# Patient Record
Sex: Female | Born: 1970
Health system: Southern US, Community
[De-identification: ages and names within clinical notes are randomized; demographics above are authoritative.]

## PROBLEM LIST (undated history)

## (undated) DIAGNOSIS — E119 Type 2 diabetes mellitus without complications: Secondary | ICD-10-CM

## (undated) DIAGNOSIS — N200 Calculus of kidney: Secondary | ICD-10-CM

## (undated) DIAGNOSIS — R000 Tachycardia, unspecified: Secondary | ICD-10-CM

## (undated) DIAGNOSIS — K5790 Diverticulosis of intestine, part unspecified, without perforation or abscess without bleeding: Secondary | ICD-10-CM

## (undated) DIAGNOSIS — K219 Gastro-esophageal reflux disease without esophagitis: Secondary | ICD-10-CM

## (undated) DIAGNOSIS — K222 Esophageal obstruction: Secondary | ICD-10-CM

## (undated) DIAGNOSIS — N83209 Unspecified ovarian cyst, unspecified side: Secondary | ICD-10-CM

## (undated) DIAGNOSIS — J309 Allergic rhinitis, unspecified: Secondary | ICD-10-CM

## (undated) DIAGNOSIS — K449 Diaphragmatic hernia without obstruction or gangrene: Secondary | ICD-10-CM

## (undated) DIAGNOSIS — G4730 Sleep apnea, unspecified: Secondary | ICD-10-CM

## (undated) DIAGNOSIS — I10 Essential (primary) hypertension: Secondary | ICD-10-CM

## (undated) DIAGNOSIS — R5383 Other fatigue: Secondary | ICD-10-CM

## (undated) DIAGNOSIS — M7030 Other bursitis of elbow, unspecified elbow: Secondary | ICD-10-CM

## (undated) HISTORY — DX: Gastro-esophageal reflux disease without esophagitis: K21.9

## (undated) HISTORY — DX: Other fatigue: R53.83

## (undated) HISTORY — DX: Allergic rhinitis, unspecified: J30.9

## (undated) HISTORY — DX: Diverticulosis of intestine, part unspecified, without perforation or abscess without bleeding: K57.90

## (undated) HISTORY — DX: Calculus of kidney: N20.0

## (undated) HISTORY — DX: Essential (primary) hypertension: I10

## (undated) HISTORY — DX: Sleep apnea, unspecified: G47.30

## (undated) HISTORY — PX: UPPER GASTROINTESTINAL ENDOSCOPY: SHX188

## (undated) HISTORY — DX: Esophageal obstruction: K22.2

## (undated) HISTORY — DX: Other bursitis of elbow, unspecified elbow: M70.30

## (undated) HISTORY — DX: Diaphragmatic hernia without obstruction or gangrene: K44.9

## (undated) HISTORY — DX: Type 2 diabetes mellitus without complications: E11.9

## (undated) HISTORY — DX: Unspecified ovarian cyst, unspecified side: N83.209

---
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
## 1991-06-01 HISTORY — PX: OTHER SURGICAL HISTORY: SHX169

## 1994-05-31 DIAGNOSIS — E119 Type 2 diabetes mellitus without complications: Secondary | ICD-10-CM

## 1994-05-31 HISTORY — DX: Type 2 diabetes mellitus without complications: E11.9

## 1998-08-26 ENCOUNTER — Encounter: Admission: RE | Admit: 1998-08-26 | Discharge: 1998-11-24 | Payer: Self-pay | Admitting: Internal Medicine

## 2000-03-16 ENCOUNTER — Encounter: Admission: RE | Admit: 2000-03-16 | Discharge: 2000-06-14 | Payer: Self-pay | Admitting: Internal Medicine

## 2000-05-03 ENCOUNTER — Encounter: Payer: Self-pay | Admitting: Gastroenterology

## 2000-05-03 ENCOUNTER — Encounter: Admission: RE | Admit: 2000-05-03 | Discharge: 2000-05-03 | Payer: Self-pay | Admitting: Gastroenterology

## 2001-02-28 ENCOUNTER — Other Ambulatory Visit: Admission: RE | Admit: 2001-02-28 | Discharge: 2001-02-28 | Payer: Self-pay | Admitting: Obstetrics and Gynecology

## 2001-07-11 ENCOUNTER — Inpatient Hospital Stay (HOSPITAL_COMMUNITY): Admission: AD | Admit: 2001-07-11 | Discharge: 2001-07-11 | Payer: Self-pay | Admitting: Obstetrics and Gynecology

## 2001-08-07 ENCOUNTER — Encounter: Payer: Self-pay | Admitting: Obstetrics and Gynecology

## 2001-08-07 ENCOUNTER — Inpatient Hospital Stay (HOSPITAL_COMMUNITY): Admission: AD | Admit: 2001-08-07 | Discharge: 2001-08-09 | Payer: Self-pay | Admitting: Obstetrics and Gynecology

## 2001-08-10 ENCOUNTER — Inpatient Hospital Stay (HOSPITAL_COMMUNITY): Admission: AD | Admit: 2001-08-10 | Discharge: 2001-08-13 | Payer: Self-pay | Admitting: *Deleted

## 2001-09-19 ENCOUNTER — Other Ambulatory Visit: Admission: RE | Admit: 2001-09-19 | Discharge: 2001-09-19 | Payer: Self-pay | Admitting: Obstetrics and Gynecology

## 2003-01-22 ENCOUNTER — Ambulatory Visit (HOSPITAL_COMMUNITY): Admission: RE | Admit: 2003-01-22 | Discharge: 2003-01-22 | Payer: Self-pay | Admitting: Internal Medicine

## 2003-01-22 ENCOUNTER — Encounter: Payer: Self-pay | Admitting: Internal Medicine

## 2003-02-18 ENCOUNTER — Other Ambulatory Visit: Admission: RE | Admit: 2003-02-18 | Discharge: 2003-02-18 | Payer: Self-pay | Admitting: Physical Therapy

## 2003-04-23 ENCOUNTER — Ambulatory Visit (HOSPITAL_COMMUNITY): Admission: RE | Admit: 2003-04-23 | Discharge: 2003-04-23 | Payer: Self-pay | Admitting: Internal Medicine

## 2004-07-02 ENCOUNTER — Ambulatory Visit: Payer: Self-pay | Admitting: Internal Medicine

## 2004-07-20 ENCOUNTER — Encounter: Admission: RE | Admit: 2004-07-20 | Discharge: 2004-07-20 | Payer: Self-pay | Admitting: Internal Medicine

## 2004-08-21 ENCOUNTER — Ambulatory Visit (HOSPITAL_COMMUNITY): Admission: RE | Admit: 2004-08-21 | Discharge: 2004-08-21 | Payer: Self-pay | Admitting: Obstetrics and Gynecology

## 2006-04-18 ENCOUNTER — Ambulatory Visit: Payer: Self-pay | Admitting: Internal Medicine

## 2011-03-24 ENCOUNTER — Inpatient Hospital Stay (HOSPITAL_COMMUNITY)
Admission: AD | Admit: 2011-03-24 | Discharge: 2011-03-26 | DRG: 641 | Disposition: A | Discharge status: Home / Self Care | Payer: 59 | Source: Ambulatory Visit | Attending: Internal Medicine | Admitting: Internal Medicine

## 2011-03-24 DIAGNOSIS — I10 Essential (primary) hypertension: Secondary | ICD-10-CM | POA: Diagnosis present

## 2011-03-24 DIAGNOSIS — E871 Hypo-osmolality and hyponatremia: Principal | ICD-10-CM | POA: Diagnosis present

## 2011-03-24 DIAGNOSIS — Z794 Long term (current) use of insulin: Secondary | ICD-10-CM

## 2011-03-24 DIAGNOSIS — Z79899 Other long term (current) drug therapy: Secondary | ICD-10-CM

## 2011-03-24 DIAGNOSIS — Z9119 Patient's noncompliance with other medical treatment and regimen: Secondary | ICD-10-CM

## 2011-03-24 DIAGNOSIS — E119 Type 2 diabetes mellitus without complications: Secondary | ICD-10-CM | POA: Diagnosis present

## 2011-03-24 DIAGNOSIS — Z91199 Patient's noncompliance with other medical treatment and regimen due to unspecified reason: Secondary | ICD-10-CM

## 2011-03-24 DIAGNOSIS — R112 Nausea with vomiting, unspecified: Secondary | ICD-10-CM | POA: Diagnosis present

## 2011-03-24 LAB — COMPREHENSIVE METABOLIC PANEL
ALT: 11 U/L (ref 0–35)
AST: 11 U/L (ref 0–37)
Albumin: 2.9 g/dL — ABNORMAL LOW (ref 3.5–5.2)
Alkaline Phosphatase: 124 U/L — ABNORMAL HIGH (ref 39–117)
BUN: 19 mg/dL (ref 6–23)
CO2: 24 mEq/L (ref 19–32)
Calcium: 10.3 mg/dL (ref 8.4–10.5)
Chloride: 95 mEq/L — ABNORMAL LOW (ref 96–112)
Creatinine, Ser: 0.78 mg/dL (ref 0.50–1.10)
GFR calc Af Amer: >90 mL/min (ref >90)
GFR calc non Af Amer: >90 mL/min (ref >90)
Glucose, Bld: 258 mg/dL — ABNORMAL HIGH (ref 70–99)
Potassium: 3.4 mEq/L — ABNORMAL LOW (ref 3.5–5.1)
Sodium: 134 mEq/L — ABNORMAL LOW (ref 135–145)
Total Bilirubin: 0.3 mg/dL (ref 0.3–1.2)
Total Protein: 8.3 g/dL (ref 6.0–8.3)

## 2011-03-24 LAB — CBC
HCT: 36.8 % (ref 36.0–46.0)
Hemoglobin: 12.6 g/dL (ref 12.0–15.0)
MCH: 27.2 pg (ref 26.0–34.0)
MCHC: 34.2 g/dL (ref 30.0–36.0)
MCV: 79.3 fL (ref 78.0–100.0)
Platelets: 374 10*3/uL (ref 150–400)
RBC: 4.64 MIL/uL (ref 3.87–5.11)
RDW: 13.5 % (ref 11.5–15.5)
WBC: 9.8 10*3/uL (ref 4.0–10.5)

## 2011-03-24 LAB — AMYLASE: Amylase: 31 U/L (ref 0–105)

## 2011-03-24 LAB — LIPASE, BLOOD: Lipase: 28 U/L (ref 11–59)

## 2011-03-24 LAB — GLUCOSE, CAPILLARY
Glucose-Capillary: 245 mg/dL — ABNORMAL HIGH (ref 70–99)
Glucose-Capillary: 235 mg/dL — ABNORMAL HIGH (ref 70–99)

## 2011-03-24 LAB — KETONES, QUALITATIVE: Acetone, Bld: NEGATIVE

## 2011-03-25 LAB — GLUCOSE, CAPILLARY
Glucose-Capillary: 179 mg/dL — ABNORMAL HIGH (ref 70–99)
Glucose-Capillary: 270 mg/dL — ABNORMAL HIGH (ref 70–99)
Glucose-Capillary: 273 mg/dL — ABNORMAL HIGH (ref 70–99)
Glucose-Capillary: 290 mg/dL — ABNORMAL HIGH (ref 70–99)
Glucose-Capillary: 313 mg/dL — ABNORMAL HIGH (ref 70–99)
Glucose-Capillary: 329 mg/dL — ABNORMAL HIGH (ref 70–99)

## 2011-03-26 LAB — BASIC METABOLIC PANEL
BUN: 13 mg/dL (ref 6–23)
CO2: 29 mEq/L (ref 19–32)
Calcium: 9.7 mg/dL (ref 8.4–10.5)
Chloride: 100 mEq/L (ref 96–112)
Creatinine, Ser: 0.73 mg/dL (ref 0.50–1.10)
GFR calc Af Amer: >90 mL/min (ref >90)
GFR calc non Af Amer: >90 mL/min (ref >90)
Glucose, Bld: 237 mg/dL — ABNORMAL HIGH (ref 70–99)
Potassium: 3.3 mEq/L — ABNORMAL LOW (ref 3.5–5.1)
Sodium: 139 mEq/L (ref 135–145)

## 2011-03-26 LAB — GLUCOSE, CAPILLARY
Glucose-Capillary: 273 mg/dL — ABNORMAL HIGH (ref 70–99)
Glucose-Capillary: 255 mg/dL — ABNORMAL HIGH (ref 70–99)
Glucose-Capillary: 251 mg/dL — ABNORMAL HIGH (ref 70–99)

## 2011-03-30 NOTE — Discharge Summary (Signed)
NAMECHERRELLE, PLANTE             ACCOUNT NO.:  [id number]  MEDICAL RECORD NO.:  [id number]  LOCATION:  6734                         FACILITY:  [facility]  PHYSICIAN:  Geoffry Paradise, M.D.  DATE OF BIRTH:  12-29-1970  DATE OF ADMISSION:  03/24/2011 DATE OF DISCHARGE:  03/26/2011                              DISCHARGE SUMMARY   DIAGNOSES AT TIME OF DISCHARGE: 1. Nausea and vomiting with dehydration and weight loss. 2. Hyperosmolar state with volume depletion. 3. Mild hyponatremia. 4. Essential hypertension. 5. Diabetes mellitus type 2 with poor control.  HISTORY OF PRESENT ILLNESS:  Ms. Claudio is a pleasant 40 year old well known to myself with a longstanding diabetes mellitus type 2, who was noncompliant and lost to followup presenting with blood sugars in the 500s, weakness, nausea, vomiting and weight loss.  She was originally seen about 7 days earlier, felt to be hyperosmolar with blood sugars in the 500s, but not frankly vomiting at that point.  She was febrile following an extensive workup, felt to have a viral illness, discharged in sliding scale insulin.  She re-presented on the day of admission on October 24 with depressive lability in blood sugars sometimes up to 500 and 12-pound weight loss with total intolerance by her report to any p.o.'s.  She denied any abdominal pain, was having some nausea and vomiting, and was admitted for further management. While in the waiting admission, she was treated aggressively in the office with antiemetics, sliding-scale insulin, leading up to the initial blood work here in the hospital.  For details, see the computer generated H and P from our office note October 24.  DATA:  Chemistries; sodium 134, potassium 3.4, chloride 95, CO2 24, glucose 258, BUN 19, creatinine 0.78, bilirubin 0.3, alk phos 124.  SGOT 11, SGPT 11, protein 8.3, albumin 2.9, calcium 10.3, amylase is 30, lipase is 28.  CBC; hemoglobin is 12.6, hematocrit 36.8,  white blood count is 9.8, platelet count is 374,000.  HOSPITAL COURSE:  The patient was admitted, hydrated with normal saline, treated with Lantus insulin.  Diabetic teaching was provided.  Diet was advanced at all times.  She had a benign abdominal exam.  No pain, had occasional loose stools.  She had no further nausea or vomiting throughout the hospitalization, had significant p.o. intake and was ambulating.  Blood sugars ran in the mid to upper 200s once diet was advanced, but she is certainly stable for discharge with further to be accomplished as an outpatient.  The patient is discharged in improved and stable condition, tolerating a diet quite well since yesterday.  She is competent with her Lantus and Humalog.  She has been independent in ADLs and ambulating.  She has had no further nausea or vomiting.  Further will be accomplished as an outpatient.  Diet is a carb-modified diet.  She will follow up with Dr. Jacky Kindle within the week in the office setting.  DISCHARGE MEDICATIONS: 1. Losartan 50 mg 1 daily. 2. Fluoxetine 40 mg 1 daily. 3. Omeprazole 20 mg daily. 4. Lantus insulin 25 units at bedtime. 5. Humalog or NovoLog insulin 4 units before each meal, plus sliding     scale as indicated.  She is to check  CBGs a.c. and nightly and we will adjust further in the outpatient setting.          ______________________________ Geoffry Paradise, M.D.     RA/MEDQ  D:  03/26/2011  T:  03/26/2011  Job:  [id number]  Electronically Signed by Geoffry Paradise M.D. on 03/30/2011 06:16:18 PM

## 2012-08-25 ENCOUNTER — Other Ambulatory Visit (INDEPENDENT_AMBULATORY_CARE_PROVIDER_SITE_OTHER): Payer: Self-pay | Admitting: General Surgery

## 2012-08-25 ENCOUNTER — Encounter (INDEPENDENT_AMBULATORY_CARE_PROVIDER_SITE_OTHER): Payer: Self-pay | Admitting: General Surgery

## 2012-08-25 ENCOUNTER — Ambulatory Visit (INDEPENDENT_AMBULATORY_CARE_PROVIDER_SITE_OTHER): Payer: 59 | Admitting: General Surgery

## 2012-08-25 VITALS — BP 154/84 | HR 86 | Resp 18 | Ht 66.0 in | Wt 249.0 lb

## 2012-08-25 DIAGNOSIS — L02219 Cutaneous abscess of trunk, unspecified: Secondary | ICD-10-CM

## 2012-08-25 HISTORY — PX: OTHER SURGICAL HISTORY: SHX169

## 2012-08-25 MED ORDER — DOXYCYCLINE HYCLATE 100 MG PO TABS: 100.0000 mg | ORAL_TABLET | Freq: Two times a day (BID) | ORAL | Status: DC

## 2012-08-25 NOTE — Addendum Note (Signed)
Addended byLiliana Cline on: 08/25/2012 05:04 PM   Modules accepted: Orders

## 2012-08-25 NOTE — Patient Instructions (Addendum)
Remove packing in the shower on Sunday. Clean open area twice a day with warm water and apply a bulky dressing.Stop taking Augmentin and take doxycycline instead.

## 2012-08-25 NOTE — Progress Notes (Signed)
Patient ID: Catherine Hebert, female   DOB: Dec 21, 1970, 42 y.o.   MRN: [id number]  Chief Complaint  Patient presents with  . Other    Eval back abscess    HPI Catherine Hebert is a 42 y.o. female.   HPI  She is referred by Dr. Jacky Kindle for further evaluation and treatment of a right lateral abdominal wall abscess.  She noted a small area 4 days ago. She's tried to pick at it and treat it with warm compresses but it has continued to enlarge. She denies fever or chills.  She saw Dr. Jacky Kindle who put her on antibiotics and sent her over here for evaluation. She denies any known trauma to the area.  Past Medical History  Diagnosis Date  . Diabetes mellitus without complication   . Hypertension   . Kidney stones   . Bursitis of elbow   . Esophageal reflux disease     Past Surgical History  Procedure Laterality Date  . Cesarean section  1998  . Cesarean section  2003  . Kidney stone removal  1993    Family History  Problem Relation Age of Onset  . Diabetes Mother   . Diabetes Father     Social History History  Substance Use Topics  . Smoking status: Former Smoker    Quit date: 08/26/2006  . Smokeless tobacco: Not on file  . Alcohol Use: No    Allergies  Allergen Reactions  . Lisinopril     Current Outpatient Prescriptions  Medication Sig Dispense Refill  . amoxicillin-clavulanate (AUGMENTIN) 875-125 MG per tablet Take 1 tablet by mouth 2 (two) times daily.      Marland Kitchen aspirin 81 MG tablet Take 81 mg by mouth daily.      Marland Kitchen FLUoxetine (PROZAC) 40 MG capsule       . HUMALOG KWIKPEN 100 UNIT/ML injection       . HYDROcodone-acetaminophen (NORCO/VICODIN) 5-325 MG per tablet Take 1 tablet by mouth every 6 (six) hours as needed for pain.      Marland Kitchen losartan-hydrochlorothiazide (HYZAAR) 50-12.5 MG per tablet       . metFORMIN (GLUCOPHAGE) 500 MG tablet Take 500 mg by mouth 2 (two) times daily with a meal.      . omeprazole (PRILOSEC) 20 MG capsule        No current  facility-administered medications for this visit.    Review of Systems Review of Systems  Constitutional: Negative for fever and chills.  Respiratory: Negative.   Cardiovascular: Negative.   Gastrointestinal:       Reflux    Blood pressure 154/84, pulse 86, resp. rate 18, height 5\' 6"  (1.676 m), weight 249 lb (112.946 kg).  Physical Exam Physical Exam  Constitutional:  Obese female who appears somewhat uncomfortable. She is very pleasant and cooperative.  Abdominal:  5 cm x 11 cm erythematous area. In the middle of this there is an indurated area with a small scab. Tenderness is present at this site. This is in the right flank area.    Data Reviewed Notes from Dr. Jacky Kindle  Assessment    Right flank wall abscess. She's been started on antibiotics and given pain medicine.     Plan    Incision and drainage. Change antibiotics to doxycycline. I've given her wound care instructions. Followup in the office in 3 weeks.  Procedure: The abscess in the right flank area was sterilely prepped and then anesthetized with Xylocaine. A full thickness elliptical incision was made in the  skin and purulent fluid was evacuated. The abscess cavity was approximately 2 cm. This was packed with iodoform gauze followed by a bulky dressing. She tolerated the procedure well. She was instructed to remove the bandage and packing in the shower in 2 days then clean the area with warm water twice a day and apply a bulky pad. She was told to call if she had heavy bleeding or signs of infection increasing.        Harmon Bommarito J 08/25/2012, 3:17 PM

## 2012-09-18 ENCOUNTER — Ambulatory Visit (INDEPENDENT_AMBULATORY_CARE_PROVIDER_SITE_OTHER): Payer: 59 | Admitting: General Surgery

## 2012-09-18 ENCOUNTER — Encounter (INDEPENDENT_AMBULATORY_CARE_PROVIDER_SITE_OTHER): Payer: Self-pay | Admitting: General Surgery

## 2012-09-18 VITALS — BP 134/82 | HR 110 | Temp 97.3°F | Ht 66.0 in | Wt 247.4 lb

## 2012-09-18 DIAGNOSIS — L02219 Cutaneous abscess of trunk, unspecified: Secondary | ICD-10-CM

## 2012-09-18 NOTE — Progress Notes (Signed)
She is here for a followup visit after incision and drainage of a right flank subcutaneous abscess on 08/25/2012.  She is doing much better. The wound has healed fairly well.  On physical exam, the right flank open wound is completely healed. There is no induration or erythema around it.  Assessment: Right flank subcutaneous abscess-has responded well to incision and drainage and antibiotics. We discussed the fact that this could recur.  Plan: Keep the area clean with warm soapy water daily. Return visit as needed.

## 2012-09-18 NOTE — Patient Instructions (Signed)
Clean the area with warm soapy water daily

## 2014-02-15 ENCOUNTER — Telehealth: Payer: Self-pay | Admitting: Neurology

## 2014-02-15 NOTE — Telephone Encounter (Signed)
Call documented in husband's chart because he is our patient/ she was calling on his behalf.

## 2014-02-15 NOTE — Telephone Encounter (Signed)
Returning a call to [place]. CB# [phone] / Sherri S.

## 2015-06-01 DIAGNOSIS — R000 Tachycardia, unspecified: Secondary | ICD-10-CM

## 2015-06-01 HISTORY — DX: Tachycardia, unspecified: R00.0

## 2015-10-06 ENCOUNTER — Ambulatory Visit (INDEPENDENT_AMBULATORY_CARE_PROVIDER_SITE_OTHER): Payer: 59 | Admitting: Neurology

## 2015-10-06 ENCOUNTER — Encounter: Payer: Self-pay | Admitting: Neurology

## 2015-10-06 VITALS — BP 152/86 | HR 92 | Resp 16 | Ht 65.0 in | Wt 224.0 lb

## 2015-10-06 DIAGNOSIS — R51 Headache: Secondary | ICD-10-CM

## 2015-10-06 DIAGNOSIS — E669 Obesity, unspecified: Secondary | ICD-10-CM | POA: Diagnosis not present

## 2015-10-06 DIAGNOSIS — R351 Nocturia: Secondary | ICD-10-CM

## 2015-10-06 DIAGNOSIS — R0681 Apnea, not elsewhere classified: Secondary | ICD-10-CM | POA: Diagnosis not present

## 2015-10-06 DIAGNOSIS — R0602 Shortness of breath: Secondary | ICD-10-CM

## 2015-10-06 DIAGNOSIS — R0683 Snoring: Secondary | ICD-10-CM | POA: Diagnosis not present

## 2015-10-06 DIAGNOSIS — R519 Headache, unspecified: Secondary | ICD-10-CM

## 2015-10-06 DIAGNOSIS — R000 Tachycardia, unspecified: Secondary | ICD-10-CM | POA: Diagnosis not present

## 2015-10-06 DIAGNOSIS — G4710 Hypersomnia, unspecified: Secondary | ICD-10-CM

## 2015-10-06 DIAGNOSIS — E0842 Diabetes mellitus due to underlying condition with diabetic polyneuropathy: Secondary | ICD-10-CM | POA: Diagnosis not present

## 2015-10-06 NOTE — Progress Notes (Signed)
Subjective:    Patient ID: Catherine Hebert is a 45 y.o. female.  HPI     Catherine Age, MD, PhD [facility] [address], [address] [place], [place] [address]  Dear Dr. Reynaldo Minium,   I saw your patient, Catherine Hebert, upon your kind request in my neurologic clinic today for initial consultation of her sleep disorder, in particular, concern for underlying obstructive sleep apnea. The patient is unaccompanied today. As you know, Catherine Hebert is a 45 year old right-handed woman with an underlying medical history of type 2 diabetes, diverticulosis, reflux disease, hiatal hernia, status post esophageal dilatation, hypertension, kidney stone, and obesity, who reports snoring and excessive daytime somnolence as well as morning headaches. I reviewed your office note from 09/29/2015, which you kindly included. She had a recent sinus infection and lingering cough.  She was told to increase her omeprazole, as she was complaining of nighttime cough. She has been on omeprazole for years. She has had snoring, witnessed apneas and excessive daytime somnolence for years. Her husband has noted pauses in her breathing. Her Epworth sleepiness score is 18 out of 24 today, her fatigue score is 45 out of 63. Bedtime can be as early as 8:30 PM and she falls asleep quickly but she does not stay asleep well. She has nocturia on average 3 times per night. Wake up time is around 7 AM but she does not wake up rested. She has occasional morning headaches. Her father has obstructive sleep apnea and has been using a CPAP machine for years. Over the past 3 years she has been able to lose about 25 pounds. She has gained about 10 pounds from her lowest weight. She is trying to lose weight. 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 She quit smoking many years ago, 10+ years ago was a social smoker, never a chain smoker. She drinks about 2 cups of coffee per day, typically no sodas or tea. She  has had problems with blood sugar control. She has been diabetic for about 20 years and has a family history of diabetes on both sides. Her A1c has been as high as 12 she reports. She is supposed to have blood work again soon. She had recent blood work in your office and an EKG. She has a resting heart rate which is on the higher side in the 90s and sometimes low 100s. She denies restless leg symptoms or leg twitching at night but has had symptoms of diabetic neuropathy, no recent painful sensation. She lives with her husband of 34 years, he is 3 years older and has some medical problems. She has 2 teenage children, ages 4 and 5.  Her Past Medical History Is Significant For: Past Medical History  Diagnosis Date  . Diabetes mellitus without complication ([place])   . Hypertension   . Kidney stones   . Bursitis of elbow   . Esophageal reflux disease   . Diverticulosis   . Esophageal stricture   . GERD (gastroesophageal reflux disease)   . Hiatal hernia   . Ovarian cyst   . Fatigue     Her Past Surgical History Is Significant For: Past Surgical History  Procedure Laterality Date  . Cesarean section  1998  . Cesarean section  2003  . Kidney stone removal  1993  . I&d back abscess  08/25/12    Her Family History Is Significant For: Family History  Problem Relation Hebert of Onset  . Diabetes Mother   .  Heart attack Mother   . Diabetes Father   . Sleep apnea Father   . Heart attack Father     Her Social History Is Significant For: Social History   Social History  . Marital Status: Married    Spouse Name: N/A  . Number of Children: 2  . Years of Education: [employer]   Social History Main Topics  . Smoking status: Former Smoker    Quit date: 08/26/2006  . Smokeless tobacco: None  . Alcohol Use: No  . Drug Use: No  . Sexual Activity: Not Asked   Other Topics Concern  . None   Social History Narrative   Drinks 2 cups of coffee a day     Her Allergies Are:  Allergies   Allergen Reactions  . Lisinopril   :   Her Current Medications Are:  Outpatient Encounter Prescriptions as of 10/06/2015  Medication Sig  . aspirin 81 MG tablet Take 81 mg by mouth daily.  Marland Kitchen FLUoxetine (PROZAC) 40 MG capsule   . Insulin Degludec (TRESIBA FLEXTOUCH) 100 UNIT/ML SOPN Inject 40 Units into the skin.  Marland Kitchen JARDIANCE 10 MG TABS tablet TK 1 T PO QD  . losartan-hydrochlorothiazide (HYZAAR) 50-12.5 MG per tablet   . metFORMIN (GLUCOPHAGE) 500 MG tablet Take 500 mg by mouth 2 (two) times daily with a meal.  . omeprazole (PRILOSEC) 20 MG capsule   . TRULICITY 1.5 0000000 SOPN INJ 1.5 MG Taylor ONCE WEEKLY  . Vitamin D, Ergocalciferol, (DRISDOL) 50000 units CAPS capsule Take 50,000 Units by mouth every 7 (seven) days.  . [DISCONTINUED] HUMALOG KWIKPEN 100 UNIT/ML injection    No facility-administered encounter medications on file as of 10/06/2015.  :  Review of Systems:  Out of a complete 14 point review of systems, all are reviewed and negative with the exception of these symptoms as listed below:   Review of Systems  Constitutional: Positive for fatigue.  Neurological:       Patient states that she wakes up many times in the night, snoring, witnessed apnea, wakes up feeling tired, morning headaches, daytime tiredness, takes naps.    Epworth Sleepiness Scale 0= would never doze 1= slight chance of dozing 2= moderate chance of dozing 3= high chance of dozing  Sitting and reading:3 Watching TV:3 Sitting inactive in a public place (ex. Theater or meeting):2 As a passenger in a car for an hour without a break:3 Lying down to rest in the afternoon:3 Sitting and talking to someone:1 Sitting quietly after lunch (no alcohol):2 In a car, while stopped in traffic:1 Total:18  Objective:  Neurologic Exam  Physical Exam Physical Examination:   Filed Vitals:   10/06/15 1509  BP: 152/86  Pulse: 92  Resp: 16   General Examination: The patient is a very pleasant 45 y.o. female  in no acute distress. She appears well-developed and well-nourished and very well groomed.   HEENT: Normocephalic, atraumatic, pupils are equal, round and reactive to light and accommodation. Full wig in place. Funduscopic exam is normal with sharp disc margins noted. Extraocular tracking is good without limitation to gaze excursion or nystagmus noted. Normal smooth pursuit is noted. Hearing is grossly intact. Face is symmetric with normal facial animation and normal facial sensation. Speech is clear with no dysarthria noted. There is no hypophonia. There is no lip, neck/head, jaw or voice tremor. Neck is supple with full range of passive and active motion. There are no carotid bruits on auscultation. Oropharynx exam reveals: moderate mouth dryness, good dental  hygiene and moderate airway crowding, due to smaller airway entry, thicker soft palate, thicker tongue. Mallampati is class III. Tongue protrudes centrally and palate elevates symmetrically. Tonsils are absent. Neck size is 15.75 inches. She has a Mild overbite.  Chest: Clear to auscultation without wheezing, rhonchi or crackles noted.  Heart: S1+S2+0, regular and normal without murmurs, rubs or gallops noted.   Abdomen: Soft, non-tender and non-distended with normal bowel sounds appreciated on auscultation.  Extremities: There is no pitting edema in the distal lower extremities bilaterally. Pedal pulses are intact.  Skin: Warm and dry without trophic changes noted. There are no varicose veins.  Musculoskeletal: exam reveals no obvious joint deformities, tenderness or joint swelling or erythema.   Neurologically:  Mental status: The patient is awake, alert and oriented in all 4 spheres. Her immediate and remote memory, attention, language skills and fund of knowledge are appropriate. There is no evidence of aphasia, agnosia, apraxia or anomia. Speech is clear with normal prosody and enunciation. Thought process is linear. Mood is normal and  affect is normal.  Cranial nerves II - XII are as described above under HEENT exam. In addition: shoulder shrug is normal with equal shoulder height noted. Motor exam: Normal bulk, strength and tone is noted. There is no drift, tremor or rebound. Romberg is negative. Reflexes are 1-2+ throughout. Babinski: Toes are flexor bilaterally. Fine motor skills and coordination: intact with normal finger taps, normal hand movements, normal rapid alternating patting, normal foot taps and normal foot agility.  Cerebellar testing: No dysmetria or intention tremor on finger to nose testing. Heel to shin is unremarkable bilaterally. There is no truncal or gait ataxia.  Sensory exam: intact to light touch, pinprick, vibration, temperature sense in the upper and lower extremities, with the exception of decreased pinprick sensation in both feet, mostly in the toes and forefeet, not up to the ankles.  Gait, station and balance: She stands easily. No veering to one side is noted. No leaning to one side is noted. Posture is Hebert-appropriate and stance is narrow based. Gait shows normal stride length and normal pace. No problems turning are noted. She turns en bloc. Tandem walk is unremarkable.  Assessment and Plan:  In summary, ALMEDA HARTKOPF is a very pleasant 45 y.o.-year old female with an underlying medical history of type 2 diabetes, diverticulosis, reflux disease, hiatal hernia, status post esophageal dilatation, hypertension, kidney stone, and obesity, whose history and physical exam are indeed in keeping with obstructive sleep apnea (OSA). She has mild evidence of peripheral neuropathy, most likely diabetic polyneuropathy, and we talked about the importance of diabetes control and weight management. Thankfully, she has been able to lose weight and is working on weight loss and has had improved diabetes control with medication changes.  I had a long chat with the patient about my findings and the diagnosis of OSA, its  prognosis and treatment options. We talked about medical treatments, surgical interventions and non-pharmacological approaches. I explained in particular the risks and ramifications of untreated moderate to severe OSA, especially with respect to developing cardiovascular disease down the Road, including congestive heart failure, difficult to treat hypertension, cardiac arrhythmias, or stroke. Even type 2 diabetes has, in part, been linked to untreated OSA. Symptoms of untreated OSA include daytime sleepiness, memory problems, mood irritability and mood disorder such as depression and anxiety, lack of energy, as well as recurrent headaches, especially morning headaches. We talked about trying to maintain a healthy lifestyle in general, as well as the  importance of weight control. I encouraged the patient to eat healthy, exercise daily and keep well hydrated, to keep a scheduled bedtime and wake time routine, to not skip any meals and eat healthy snacks in between meals. I advised the patient not to drive when feeling sleepy. I recommended the following at this time: sleep study with potential positive airway pressure titration. (We will score hypopneas at 4% and split the sleep study into diagnostic and treatment portion, if the estimated. 2 hour AHI is >20/h).   I explained the sleep test procedure to the patient and also outlined possible surgical and non-surgical treatment options of OSA, including the use of a custom-made dental device (which would require a referral to a specialist dentist or oral surgeon), upper airway surgical options, such as pillar implants, radiofrequency surgery, tongue base surgery, and UPPP (which would involve a referral to an ENT surgeon). Rarely, jaw surgery such as mandibular advancement may be considered.  I also explained the CPAP treatment option to the patient, who indicated that she would be willing to try CPAP if the need arises. I explained the importance of being  compliant with PAP treatment, not only for insurance purposes but primarily to improve Her symptoms, and for the patient's long term health benefit, including to reduce Her cardiovascular risks. I answered all her questions today and the patient was in agreement. I would like to see her back after the sleep study is completed and encouraged her to call with any interim questions, concerns, problems or updates.   Thank you very much for allowing me to participate in the care of this nice patient. If I can be of any further assistance to you please do not hesitate to call me at [phone].  Sincerely,   Catherine Age, MD, PhD

## 2015-10-06 NOTE — Patient Instructions (Signed)

## 2015-10-31 ENCOUNTER — Observation Stay (HOSPITAL_COMMUNITY)
Admission: EM | Admit: 2015-10-31 | Discharge: 2015-11-02 | Disposition: A | Discharge status: Home / Self Care | Payer: 59 | Attending: Internal Medicine | Admitting: Internal Medicine

## 2015-10-31 ENCOUNTER — Encounter (HOSPITAL_COMMUNITY): Payer: Self-pay | Admitting: Emergency Medicine

## 2015-10-31 ENCOUNTER — Emergency Department (HOSPITAL_COMMUNITY): Payer: 59

## 2015-10-31 DIAGNOSIS — I10 Essential (primary) hypertension: Secondary | ICD-10-CM | POA: Diagnosis not present

## 2015-10-31 DIAGNOSIS — E119 Type 2 diabetes mellitus without complications: Secondary | ICD-10-CM | POA: Insufficient documentation

## 2015-10-31 DIAGNOSIS — Z7982 Long term (current) use of aspirin: Secondary | ICD-10-CM | POA: Diagnosis not present

## 2015-10-31 DIAGNOSIS — Z79899 Other long term (current) drug therapy: Secondary | ICD-10-CM | POA: Insufficient documentation

## 2015-10-31 DIAGNOSIS — Z7984 Long term (current) use of oral hypoglycemic drugs: Secondary | ICD-10-CM | POA: Diagnosis not present

## 2015-10-31 DIAGNOSIS — Z6836 Body mass index (BMI) 36.0-36.9, adult: Secondary | ICD-10-CM | POA: Diagnosis not present

## 2015-10-31 DIAGNOSIS — E785 Hyperlipidemia, unspecified: Secondary | ICD-10-CM | POA: Insufficient documentation

## 2015-10-31 DIAGNOSIS — I480 Paroxysmal atrial fibrillation: Secondary | ICD-10-CM | POA: Insufficient documentation

## 2015-10-31 DIAGNOSIS — Z833 Family history of diabetes mellitus: Secondary | ICD-10-CM | POA: Diagnosis not present

## 2015-10-31 DIAGNOSIS — IMO0001 Reserved for inherently not codable concepts without codable children: Secondary | ICD-10-CM

## 2015-10-31 DIAGNOSIS — F329 Major depressive disorder, single episode, unspecified: Secondary | ICD-10-CM | POA: Insufficient documentation

## 2015-10-31 DIAGNOSIS — K219 Gastro-esophageal reflux disease without esophagitis: Secondary | ICD-10-CM | POA: Diagnosis not present

## 2015-10-31 DIAGNOSIS — E669 Obesity, unspecified: Secondary | ICD-10-CM | POA: Insufficient documentation

## 2015-10-31 DIAGNOSIS — R778 Other specified abnormalities of plasma proteins: Secondary | ICD-10-CM | POA: Insufficient documentation

## 2015-10-31 DIAGNOSIS — Z8249 Family history of ischemic heart disease and other diseases of the circulatory system: Secondary | ICD-10-CM | POA: Diagnosis not present

## 2015-10-31 DIAGNOSIS — R7989 Other specified abnormal findings of blood chemistry: Secondary | ICD-10-CM

## 2015-10-31 DIAGNOSIS — I471 Supraventricular tachycardia: Secondary | ICD-10-CM | POA: Diagnosis not present

## 2015-10-31 DIAGNOSIS — D72829 Elevated white blood cell count, unspecified: Secondary | ICD-10-CM | POA: Insufficient documentation

## 2015-10-31 DIAGNOSIS — R42 Dizziness and giddiness: Secondary | ICD-10-CM | POA: Insufficient documentation

## 2015-10-31 DIAGNOSIS — Z794 Long term (current) use of insulin: Secondary | ICD-10-CM | POA: Insufficient documentation

## 2015-10-31 DIAGNOSIS — Z87891 Personal history of nicotine dependence: Secondary | ICD-10-CM | POA: Diagnosis not present

## 2015-10-31 DIAGNOSIS — I472 Ventricular tachycardia: Secondary | ICD-10-CM | POA: Insufficient documentation

## 2015-10-31 DIAGNOSIS — F32A Depression, unspecified: Secondary | ICD-10-CM

## 2015-10-31 DIAGNOSIS — B029 Zoster without complications: Secondary | ICD-10-CM | POA: Insufficient documentation

## 2015-10-31 LAB — I-STAT BETA HCG BLOOD, ED (MC, WL, AP ONLY): I-stat hCG, quantitative: <5 m[IU]/mL (ref <5)

## 2015-10-31 LAB — BASIC METABOLIC PANEL
Anion gap: 15 (ref 5–15)
BUN: 19 mg/dL (ref 6–20)
CO2: 18 mmol/L — ABNORMAL LOW (ref 22–32)
Calcium: 10.3 mg/dL (ref 8.9–10.3)
Chloride: 104 mmol/L (ref 101–111)
Creatinine, Ser: 0.87 mg/dL (ref 0.44–1.00)
GFR calc Af Amer: >60 mL/min (ref >60)
GFR calc non Af Amer: >60 mL/min (ref >60)
Glucose, Bld: 127 mg/dL — ABNORMAL HIGH (ref 65–99)
Potassium: 3.5 mmol/L (ref 3.5–5.1)
Sodium: 137 mmol/L (ref 135–145)

## 2015-10-31 LAB — GLUCOSE, CAPILLARY
Glucose-Capillary: 99 mg/dL (ref 65–99)
Glucose-Capillary: 174 mg/dL — ABNORMAL HIGH (ref 65–99)

## 2015-10-31 LAB — CBC
HCT: 40.1 % (ref 36.0–46.0)
Hemoglobin: 13.2 g/dL (ref 12.0–15.0)
MCH: 26.7 pg (ref 26.0–34.0)
MCHC: 32.9 g/dL (ref 30.0–36.0)
MCV: 81 fL (ref 78.0–100.0)
Platelets: 253 10*3/uL (ref 150–400)
RBC: 4.95 MIL/uL (ref 3.87–5.11)
RDW: 14 % (ref 11.5–15.5)
WBC: 12.8 10*3/uL — ABNORMAL HIGH (ref 4.0–10.5)

## 2015-10-31 LAB — I-STAT TROPONIN, ED: Troponin i, poc: 0.57 ng/mL (ref 0.00–0.08)

## 2015-10-31 LAB — TSH: TSH: 2.099 u[IU]/mL (ref 0.350–4.500)

## 2015-10-31 LAB — PROTIME-INR
INR: 1.03 (ref 0.00–1.49)
Prothrombin Time: 13.7 seconds (ref 11.6–15.2)

## 2015-10-31 LAB — BRAIN NATRIURETIC PEPTIDE: B Natriuretic Peptide: 548.1 pg/mL — ABNORMAL HIGH (ref 0.0–100.0)

## 2015-10-31 LAB — CBG MONITORING, ED: Glucose-Capillary: 120 mg/dL — ABNORMAL HIGH (ref 65–99)

## 2015-10-31 MED ORDER — BISACODYL 10 MG RE SUPP: 10.0000 mg | Freq: Every day | RECTAL | Status: DC | PRN

## 2015-10-31 MED ORDER — MAGNESIUM CITRATE PO SOLN: 1.0000 | Freq: Once | ORAL | Status: DC | PRN

## 2015-10-31 MED ORDER — SODIUM CHLORIDE 0.9 % IV SOLN
INTRAVENOUS | Status: DC
  Administered 2015-10-31 (×2): via INTRAVENOUS

## 2015-10-31 MED ORDER — FLUOXETINE HCL 20 MG PO CAPS
40.0000 mg | ORAL_CAPSULE | Freq: Every day | ORAL | Status: DC
  Administered 2015-10-31 – 2015-11-02 (×3): 40 mg via ORAL
  Filled 2015-10-31: qty 2
  Filled 2015-10-31: qty 1
  Filled 2015-10-31: qty 2
  Filled 2015-10-31 (×2): qty 1

## 2015-10-31 MED ORDER — HEPARIN SODIUM (PORCINE) 5000 UNIT/ML IJ SOLN: 5000.0000 [IU] | Freq: Three times a day (TID) | INTRAMUSCULAR | Status: DC

## 2015-10-31 MED ORDER — PANTOPRAZOLE SODIUM 40 MG PO TBEC
40.0000 mg | DELAYED_RELEASE_TABLET | Freq: Every day | ORAL | Status: DC
  Administered 2015-10-31 – 2015-11-02 (×3): 40 mg via ORAL
  Filled 2015-10-31 (×3): qty 1

## 2015-10-31 MED ORDER — DILTIAZEM HCL 100 MG IV SOLR
5.0000 mg/h | INTRAVENOUS | Status: DC
  Administered 2015-10-31 – 2015-11-01 (×2): 5 mg/h via INTRAVENOUS
  Filled 2015-10-31: qty 100

## 2015-10-31 MED ORDER — ACYCLOVIR 200 MG PO CAPS
800.0000 mg | ORAL_CAPSULE | Freq: Every day | ORAL | Status: DC
  Administered 2015-10-31 – 2015-11-02 (×7): 800 mg via ORAL
  Filled 2015-10-31 (×14): qty 4

## 2015-10-31 MED ORDER — ONDANSETRON HCL 4 MG PO TABS: 4.0000 mg | ORAL_TABLET | Freq: Four times a day (QID) | ORAL | Status: DC | PRN

## 2015-10-31 MED ORDER — SODIUM CHLORIDE 0.9% FLUSH
3.0000 mL | Freq: Two times a day (BID) | INTRAVENOUS | Status: DC
  Administered 2015-10-31 – 2015-11-02 (×4): 3 mL via INTRAVENOUS

## 2015-10-31 MED ORDER — ONDANSETRON HCL 4 MG/2ML IJ SOLN: 4.0000 mg | Freq: Four times a day (QID) | INTRAMUSCULAR | Status: DC | PRN

## 2015-10-31 MED ORDER — HEPARIN BOLUS VIA INFUSION
4000.0000 [IU] | Freq: Once | INTRAVENOUS | Status: DC
  Filled 2015-10-31: qty 4000

## 2015-10-31 MED ORDER — ACETAMINOPHEN 650 MG RE SUPP: 650.0000 mg | Freq: Four times a day (QID) | RECTAL | Status: DC | PRN

## 2015-10-31 MED ORDER — ASPIRIN EC 81 MG PO TBEC: 81.0000 mg | DELAYED_RELEASE_TABLET | Freq: Every day | ORAL | Status: DC

## 2015-10-31 MED ORDER — ASPIRIN EC 81 MG PO TBEC
81.0000 mg | DELAYED_RELEASE_TABLET | Freq: Every day | ORAL | Status: DC
  Administered 2015-10-31 – 2015-11-02 (×3): 81 mg via ORAL
  Filled 2015-10-31 (×4): qty 1

## 2015-10-31 MED ORDER — HYDROCODONE-ACETAMINOPHEN 5-325 MG PO TABS: 1.0000 | ORAL_TABLET | ORAL | Status: DC | PRN

## 2015-10-31 MED ORDER — DILTIAZEM HCL 25 MG/5ML IV SOLN
10.0000 mg | Freq: Once | INTRAVENOUS | Status: AC
  Administered 2015-10-31: 10 mg via INTRAVENOUS

## 2015-10-31 MED ORDER — SENNOSIDES-DOCUSATE SODIUM 8.6-50 MG PO TABS: 1.0000 | ORAL_TABLET | Freq: Every evening | ORAL | Status: DC | PRN

## 2015-10-31 MED ORDER — TRAZODONE HCL 50 MG PO TABS: 25.0000 mg | ORAL_TABLET | Freq: Every evening | ORAL | Status: DC | PRN

## 2015-10-31 MED ORDER — DILTIAZEM HCL 100 MG IV SOLR
INTRAVENOUS | Status: AC
  Filled 2015-10-31: qty 100

## 2015-10-31 MED ORDER — ACETAMINOPHEN 325 MG PO TABS: 650.0000 mg | ORAL_TABLET | Freq: Four times a day (QID) | ORAL | Status: DC | PRN

## 2015-10-31 MED ORDER — MORPHINE SULFATE (PF) 2 MG/ML IV SOLN: 1.0000 mg | INTRAVENOUS | Status: DC | PRN

## 2015-10-31 MED ORDER — DILTIAZEM HCL 100 MG IV SOLR
5.0000 mg/h | Freq: Once | INTRAVENOUS | Status: AC
  Administered 2015-10-31: 5 mg/h via INTRAVENOUS

## 2015-10-31 MED ORDER — HEPARIN (PORCINE) IN NACL 100-0.45 UNIT/ML-% IJ SOLN
1150.0000 [IU]/h | INTRAMUSCULAR | Status: DC
  Administered 2015-10-31 (×2): 1150 [IU]/h via INTRAVENOUS
  Filled 2015-10-31 (×2): qty 250

## 2015-10-31 MED ORDER — INSULIN ASPART 100 UNIT/ML ~~LOC~~ SOLN
0.0000 [IU] | Freq: Three times a day (TID) | SUBCUTANEOUS | Status: DC
  Administered 2015-11-01: 3 [IU] via SUBCUTANEOUS
  Administered 2015-11-01: 2 [IU] via SUBCUTANEOUS
  Administered 2015-11-01: 3 [IU] via SUBCUTANEOUS

## 2015-10-31 MED ORDER — METOPROLOL TARTRATE 5 MG/5ML IV SOLN
5.0000 mg | INTRAVENOUS | Status: DC
  Administered 2015-10-31 – 2015-11-01 (×4): 5 mg via INTRAVENOUS
  Filled 2015-10-31 (×4): qty 5

## 2015-10-31 NOTE — ED Provider Notes (Signed)
CSN: [id number]     Arrival date & time 10/31/15  1326 History   First MD Initiated Contact with Patient 10/31/15 1328     Chief Complaint  Patient presents with  . Tachycardia     (Consider location/radiation/quality/duration/timing/severity/associated sxs/prior Treatment) HPI   45 year old female transported via EMS from 33 office with reports that she is extremely tachycardic. Patient states that she has been feeling sick for approximately 3 weeks. She has been seen by her primary care physician several times and treated with antibiotics. She is an insulin-dependent diabetic. She states that she felt worse today and was weak. When she was seen in her primary care office she was hyperventilating and seemed very upset. Her husband's Parkinson's. She has a 71 and 24 year old child. She denies headache, head injury, neck pain, chest pain, history of DVT or PE.  Past Medical History  Diagnosis Date  . Diabetes mellitus without complication ([place])   . Hypertension   . Kidney stones   . Bursitis of elbow   . Esophageal reflux disease   . Diverticulosis   . Esophageal stricture   . GERD (gastroesophageal reflux disease)   . Hiatal hernia   . Ovarian cyst   . Fatigue    Past Surgical History  Procedure Laterality Date  . Cesarean section  1998  . Cesarean section  2003  . Kidney stone removal  1993  . I&d back abscess  08/25/12   Family History  Problem Relation Age of Onset  . Diabetes Mother   . Heart attack Mother   . Diabetes Father   . Sleep apnea Father   . Heart attack Father    Social History  Substance Use Topics  . Smoking status: Former Smoker    Quit date: 08/26/2006  . Smokeless tobacco: None  . Alcohol Use: No   OB History    No data available     Review of Systems  All other systems reviewed and are negative.     Allergies  Lisinopril  Home Medications   Prior to Admission medications   Medication Sig Start Date End Date Taking?  Authorizing Provider  aspirin 81 MG tablet Take 81 mg by mouth daily.    Historical Provider, MD  FLUoxetine (PROZAC) 40 MG capsule  08/10/12   Historical Provider, MD  Insulin Degludec (TRESIBA FLEXTOUCH) 100 UNIT/ML SOPN Inject 40 Units into the skin.    Historical Provider, MD  JARDIANCE 10 MG TABS tablet TK 1 T PO QD 07/07/15   Historical Provider, MD  losartan-hydrochlorothiazide (HYZAAR) 50-12.5 MG per tablet  08/10/12   Historical Provider, MD  metFORMIN (GLUCOPHAGE) 500 MG tablet Take 500 mg by mouth 2 (two) times daily with a meal.    Historical Provider, MD  omeprazole (PRILOSEC) 20 MG capsule  08/10/12   Historical Provider, MD  TRULICITY 1.5 0000000 SOPN INJ 1.5 MG [place] ONCE WEEKLY 09/03/15   Historical Provider, MD  Vitamin D, Ergocalciferol, (DRISDOL) 50000 units CAPS capsule Take 50,000 Units by mouth every 7 (seven) days.    Historical Provider, MD   Temp(Src) 99.8 F (37.7 C) (Rectal)  SpO2 100% Physical Exam  Constitutional: She is oriented to person, place, and time. She appears well-developed and well-nourished. She appears distressed.  HENT:  Head: Normocephalic and atraumatic.  Right Ear: External ear normal.  Left Ear: External ear normal.  Nose: Nose normal.  Mouth/Throat: Oropharynx is clear and moist.  Eyes: Conjunctivae and EOM are normal. Pupils are equal, round, and reactive  to light.  Neck: Normal range of motion. Neck supple.  Cardiovascular: Normal heart sounds and intact distal pulses.  An irregularly irregular rhythm present. Tachycardia present.   Pulmonary/Chest: Effort normal and breath sounds normal.  Abdominal: Soft. Bowel sounds are normal.  Musculoskeletal: Normal range of motion.  Neurological: She is alert and oriented to person, place, and time. She has normal reflexes.  Skin: Skin is warm and dry.  Psychiatric: She has a normal mood and affect. Her behavior is normal. Judgment and thought content normal.  Nursing note and vitals reviewed.   ED  Course  Procedures (including critical care time) Labs Review Labs Reviewed  CBC - Abnormal; Notable for the following:    WBC 12.8 (*)    All other components within normal limits  BASIC METABOLIC PANEL - Abnormal; Notable for the following:    CO2 18 (*)    Glucose, Bld 127 (*)    All other components within normal limits  CBG MONITORING, ED - Abnormal; Notable for the following:    Glucose-Capillary 120 (*)    All other components within normal limits  I-STAT TROPOININ, ED - Abnormal; Notable for the following:    Troponin i, poc 0.57 (*)    All other components within normal limits  PROTIME-INR  I-STAT BETA HCG BLOOD, ED ([facility], [facility], [facility] ONLY)    Imaging Review Dg Chest Port 1 View  10/31/2015  CLINICAL DATA:  Tachycardia.  Dizziness.  Emotional stress. EXAM: PORTABLE CHEST 1 VIEW COMPARISON:  None. FINDINGS: The heart size and mediastinal contours are within normal limits. Both lungs are clear. The visualized skeletal structures are unremarkable. IMPRESSION: No active disease. Electronically Signed   By: Nelson Chimes M.D.   On: 10/31/2015 14:32   I have personally reviewed and evaluated these images and lab results as part of my medical decision-making.   EKG Interpretation   Date/Time:  Friday October 31 2015 14:04:52 EDT Ventricular Rate:  90 PR Interval:  158 QRS Duration: 95 QT Interval:  377 QTC Calculation: 461 R Axis:   88 Text Interpretation:  Sinus rhythm Borderline repolarization abnormality  Confirmed by Jamiah Recore MD, Jaiveer Panas ([id number]) on 10/31/2015 2:42:25 PM      MDM   Final diagnoses:  Paroxysmal atrial fibrillation (HCC)  Elevated troponin    Patient presented with tachycardia. She is given IV Cardizem and converted to normal sinus rhythm. Troponin is elevated. Plan admission to cycle troponins and cardiology consult.    Pattricia Boss, MD 11/01/15 [phone]

## 2015-10-31 NOTE — ED Notes (Signed)
Pt able to ambulate independently 

## 2015-10-31 NOTE — H&P (Signed)
History and Physical    Catherine Hebert [id number] DOB: Jun 10, 1970 DOA: 10/31/2015   PCP: Geoffery Lyons, MD   Patient coming from:  Home    Chief Complaint: Tachycardia  HPI: Catherine Hebert is a 45 y.o. female with medical history significant for IDDM, Depression, GERD, hypersomnia, obesity, presenting to the emergency room with significant tachycardia. The patient had been feeling very sick over the last 3 weeks. Prior to that, she had been treated for upper respiratory infections since February, taking many antibiotics without complete resolution of the symptoms. On presentation, she was symptomatic, having palpitations, very inches and hyperventilating, tearful. She feels very weak, with no energy.  Denies fevers, chills, night sweats, vision changes, or mucositis. Denies any respiratory complaints. Denies any chest pain or pleuritic chest pain.  Denies lower extremity swelling. Denies nausea, heartburn or change in bowel habits. Denies abdominal pain. Appetite is normal. Denies any dysuria. She has been diagnosed with shingles today, at her PCPs office. Denies any bleeding issues such as epistaxis, hematemesis, hematuria or hematochezia. Denies any sick contacts or ticks. +stressors at home with sick husband with Parkinsons, teenage children and job stress. This is the first time patient experiences these tachycardic symptoms. No significant caffeine, no tobacco or other drugs. Compliant with her meds.   ED Course:  BP 125/96 mmHg  Pulse 100  Temp(Src) 99.8 F (37.7 C) (Rectal)  Resp 13  SpO2 98%   Rate was at 180 on arrival.  EKG  At this time with sinus rhythm  with borderline repolarization, QTc 461Cardizem with conversion to SR, occasional  Irregular beats.Tn 0.54 with other Tn pending. Cardiology to see. White count 12.8,glucose 127. Chest x-ray negative for active disease.  Review of Systems: As per HPI otherwise 10 point review of systems negative.   Past Medical History   Diagnosis Date  . Diabetes mellitus without complication ([place])   . Hypertension   . Kidney stones   . Bursitis of elbow   . Esophageal reflux disease   . Diverticulosis   . Esophageal stricture   . GERD (gastroesophageal reflux disease)   . Hiatal hernia   . Ovarian cyst   . Fatigue     Past Surgical History  Procedure Laterality Date  . Cesarean section  1998  . Cesarean section  2003  . Kidney stone removal  1993  . I&d back abscess  08/25/12     reports that she quit smoking about 9 years ago. She does not have any smokeless tobacco history on file. She reports that she does not drink alcohol or use illicit drugs. Walks unassisted  with cane  with walker Patient is on  wheelchair  Allergies  Allergen Reactions  . Lisinopril     cough    Family History  Problem Relation Age of Onset  . Diabetes Mother   . Heart attack Mother   . Diabetes Father   . Sleep apnea Father   . Heart attack Father       Prior to Admission medications   Medication Sig Start Date End Date Taking? Authorizing Provider  aspirin 81 MG tablet Take 81 mg by mouth daily.   Yes Historical Provider, MD  FLUoxetine (PROZAC) 40 MG capsule Take 40 mg by mouth daily.  08/10/12  Yes Historical Provider, MD  Insulin Degludec (TRESIBA FLEXTOUCH) 100 UNIT/ML SOPN Inject 46 Units into the skin every morning.    Yes Historical Provider, MD  JARDIANCE 10 MG TABS tablet TK 1 T  PO QD 07/07/15  Yes Historical Provider, MD  losartan-hydrochlorothiazide (HYZAAR) 50-12.5 MG per tablet  08/10/12  Yes Historical Provider, MD  metFORMIN (GLUCOPHAGE) 500 MG tablet Take 1,000 mg by mouth 2 (two) times daily with a meal.    Yes Historical Provider, MD  omeprazole (PRILOSEC) 20 MG capsule Take 20 mg by mouth 2 (two) times daily before a meal.  08/10/12  Yes Historical Provider, MD  TRULICITY 1.5 0000000 SOPN INJ 1.5 MG [place] ONCE WEEKLY 09/03/15  Yes Historical Provider, MD  Vitamin D, Ergocalciferol, (DRISDOL) 50000 units CAPS  capsule Take 50,000 Units by mouth every 7 (seven) days.   Yes Historical Provider, MD  levofloxacin (LEVAQUIN) 500 MG tablet Take 500 mg by mouth daily. For 7 days 10/20/15   Historical Provider, MD    Physical Exam:    Filed Vitals:   10/31/15 1437 10/31/15 1500 10/31/15 1530 10/31/15 1600  BP: 108/83 110/71 118/65 125/96  Pulse: 102 97 93 100  Temp:      TempSrc:      Resp: 16 15 14 13   SpO2: 99% 97% 96% 98%      Constitutional: NAD, but tearful  Filed Vitals:   10/31/15 1437 10/31/15 1500 10/31/15 1530 10/31/15 1600  BP: 108/83 110/71 118/65 125/96  Pulse: 102 97 93 100  Temp:      TempSrc:      Resp: 16 15 14 13   SpO2: 99% 97% 96% 98%   Eyes: PERRL, lids and conjunctivae normal ENMT: Mucous membranes are moist. Posterior pharynx clear of any exudate or lesions.Normal dentition.  Neck: normal, supple, no masses, no thyromegaly Respiratory: clear to auscultation bilaterally, no wheezing, no crackles. Normal respiratory effort. No accessory muscle use.  Cardiovascular:  Tachy  rate with occasional ectopic beats, very soft 1+ murmurs / rubs / gallops. No extremity edema. 2+ pedal pulses. No carotid bruits.  Abdomen: no tenderness, no masses palpated. No hepatosplenomegaly. Bowel sounds positive. Left [place] with visible vesicles in a  Dermatome from c/w shingles  Musculoskeletal: no clubbing / cyanosis. No joint deformity upper and lower extremities. Good ROM, no contractures. Normal muscle tone.  Skin: w shingles as mentioned above no rashes, lesions, ulcers. No induration Neurologic: CN 2-12 grossly intact. Sensation intact, DTR normal. Strength 5/5 in all 4.  Psychiatric: Normal judgment and insight. Alert and oriented x 3. Anxious mood.     Labs on Admission: I have personally reviewed following labs and imaging studies  CBC:  Recent Labs Lab 10/31/15 1340  WBC 12.8*  HGB 13.2  HCT 40.1  MCV 81.0  PLT 123456    Basic Metabolic Panel:  Recent Labs Lab  10/31/15 1340  NA 137  K 3.5  CL 104  CO2 18*  GLUCOSE 127*  BUN 19  CREATININE 0.87  CALCIUM 10.3    GFR: CrCl cannot be calculated (Unknown ideal weight.).  Liver Function Tests: No results for input(s): AST, ALT, ALKPHOS, BILITOT, PROT, ALBUMIN in the last 168 hours. No results for input(s): LIPASE, AMYLASE in the last 168 hours. No results for input(s): AMMONIA in the last 168 hours.  Coagulation Profile:  Recent Labs Lab 10/31/15 1504  INR 1.03    Cardiac Enzymes: No results for input(s): CKTOTAL, CKMB, CKMBINDEX, TROPONINI in the last 168 hours.  BNP (last 3 results) No results for input(s): PROBNP in the last 8760 hours.  HbA1C: No results for input(s): HGBA1C in the last 72 hours.  CBG:  Recent Labs Lab 10/31/15 1342  GLUCAP 120*    Lipid Profile: No results for input(s): CHOL, HDL, LDLCALC, TRIG, CHOLHDL, LDLDIRECT in the last 72 hours.  Thyroid Function Tests: No results for input(s): TSH, T4TOTAL, FREET4, T3FREE, THYROIDAB in the last 72 hours.  Anemia Panel: No results for input(s): VITAMINB12, FOLATE, FERRITIN, TIBC, IRON, RETICCTPCT in the last 72 hours.  Urine analysis: No results found for: COLORURINE, APPEARANCEUR, LABSPEC, PHURINE, GLUCOSEU, HGBUR, BILIRUBINUR, KETONESUR, PROTEINUR, UROBILINOGEN, NITRITE, LEUKOCYTESUR  Sepsis Labs: @LABRCNTIP (procalcitonin:4,lacticidven:4) )No results found for this or any previous visit (from the past 240 hour(s)).   Radiological Exams on Admission: Dg Chest Port 1 View  10/31/2015  CLINICAL DATA:  Tachycardia.  Dizziness.  Emotional stress. EXAM: PORTABLE CHEST 1 VIEW COMPARISON:  None. FINDINGS: The heart size and mediastinal contours are within normal limits. Both lungs are clear. The visualized skeletal structures are unremarkable. IMPRESSION: No active disease. Electronically Signed   By: Nelson Chimes M.D.   On: 10/31/2015 14:32    EKG: Independently reviewed.  Assessment/Plan Principal  Problem:   Atrial tachycardia (HCC) Active Problems:   IDDM (insulin dependent diabetes mellitus) (HCC)   Hypertension   Hyperlipemia   Obesity   GERD (gastroesophageal reflux disease)   Depression   Leukocytosis   Shingles   Atrial Tachycardia, unknown etiology, in the setting of recent infections, stress at home. Rule out other causes such as thyroid disease. EKG with RVR at 180 on presentation. Received Cardizem IV 10 mg  with improvement of rate and rhythm.  Cards consult, will await input. Appreciate involvement Continue ASA 2D Echo  BNP Anticoagulation with Heparin Check TSH    Hypertension BP 125/96 mmHg  Pulse 100  Temp(Src) 99.8 F (37.7 C) (Rectal)  Resp 13  SpO2 98% Continue home anti-hypertensive medications.  Appreciate Cards input   Leukocytosis, likely reactive,with viral component  WBC 12.8  Afebrile Cultures   Repeat CBC in AM Would hold antibiotics for now   Shingles LUQ abdomen  Start oral  acyclovir per Pharmacy   Type II Diabetes   blood sugar level is 127 DM order set No results found for: HGBA1C Hgb A1C Hold home oral diabetic medications.  SSI  NPO for now, then switch to Heart healthy carb modified diet.  Depression Continue Prilosec  GERD Continue Protonix   DVT prophylaxisHeparin  Code Status:   Full   Family Communication:  Discussed with patient  Disposition Plan: Expect patient to be discharged to home after condition improves Consults called:    None Admission status: Inpatient  Tele    [facility] E, PA-C [facility]   If 7PM-7AM, please contact night-coverage [email or web address] Password [email or web address]  10/31/2015, 4:18 PM

## 2015-10-31 NOTE — Progress Notes (Addendum)
Pharmacy Antibiotic Note  ILAYDA LONGEST is a 45 y.o. female with multiple comorbidities admitted on 10/31/2015 due to feeling sick. New Shingles diagnosis. Will begin Acyclovir.  Plan:  -Acyclovir 800 mg PO 5x/day      Temp (24hrs), Avg:99.8 F (37.7 C), Min:99.8 F (37.7 C), Max:99.8 F (37.7 C)   Recent Labs Lab 10/31/15 1340  WBC 12.8*  CREATININE 0.87    CrCl cannot be calculated (Unknown ideal weight.).    Allergies  Allergen Reactions  . Lisinopril     cough    Antimicrobials this admission: Acyclovir   Dose adjustments this admission: NA  Microbiology results: NA  Thank you for allowing pharmacy to be a part of this patient's care.  Harvel Quale 10/31/2015 4:58 PM    ANTICOAGULATION CONSULT NOTE - Initial Consult  Pharmacy Consult for heparin Indication: atrial fibrillation  Allergies  Allergen Reactions  . Lisinopril     cough    Patient Measurements:   Heparin Dosing Weight: 80 kg  Vital Signs: Temp: 99.4 F (37.4 C) (06/02 1729) Temp Source: Oral (06/02 1729) BP: 121/77 mmHg (06/02 1700) Pulse Rate: 97 (06/02 1700)  Labs:  Recent Labs  10/31/15 1340 10/31/15 1504  HGB 13.2  --   HCT 40.1  --   PLT 253  --   LABPROT  --  13.7  INR  --  1.03  CREATININE 0.87  --     CrCl cannot be calculated (Unknown ideal weight.).   Medical History: Past Medical History  Diagnosis Date  . Diabetes mellitus without complication ([place])   . Hypertension   . Kidney stones   . Bursitis of elbow   . Esophageal reflux disease   . Diverticulosis   . Esophageal stricture   . GERD (gastroesophageal reflux disease)   . Hiatal hernia   . Ovarian cyst   . Fatigue     Medications:  See EMR   Assessment: Heparin for new onset AFib. CBC wnl. Dc SQ heparin.   Goal of Therapy:  Heparin level 0.3-0.7 units/ml Monitor platelets by anticoagulation protocol: Yes   Plan:  Heparin bolus 4000 units x1 then 1150 units/hr Daily HL and  CBC First level at 0000     Harvel Quale 10/31/2015,5:35 PM

## 2015-10-31 NOTE — Consult Note (Signed)
Reason for Consult: tachycardia and elevated troponin   Referring Physician:    PCP:  Geoffery Lyons, MD  Primary Cardiologist:New Dr. Ihor Gully Catherine Hebert is an 45 y.o. female.    Chief Complaint:  Significant dizziness and racing HR   HPI:  45 year old female we are asked to see for rapid HR and elevated troponins.  She has no cardiac hx but + diabetes-IDDM, HTN, GERD and premature FH of CAD in mother in her 50s and father in 41s and cousin just died with MI at 64.     She has been sick off and on for 3 months with PNA, Bronchitis and ear infections with decrease in energy and DOE.  Today she became suddenly dizzy at 1:00 Pm today,SOB, hands were numb and heart was racing.  No chest pain.  She went to PCP she felt like a panic attack but she has never had one.  She was then sent to ER.  Has never had this happen before.    Currently on IV dilt drip at 10 with going in and out of SR and atrial tach.  It is regular when rapid. With rates up to 160.  No recent OTC meds.  CXR without acute process.  EKG : atrial tach is regular with rate 178 ? Flutter or SVT or A fib. Non specific ST changes.  Troponin poc  0.57.  Past Medical History  Diagnosis Date  . Diabetes mellitus without complication ([place])   . Hypertension   . Kidney stones   . Bursitis of elbow   . Esophageal reflux disease   . Diverticulosis   . Esophageal stricture   . GERD (gastroesophageal reflux disease)   . Hiatal hernia   . Ovarian cyst   . Fatigue     Past Surgical History  Procedure Laterality Date  . Cesarean section  1998  . Cesarean section  2003  . Kidney stone removal  1993  . I&d back abscess  08/25/12    Family History  Problem Relation Age of Onset  . Diabetes Mother   . Heart attack Mother 2  . Diabetes Father   . Sleep apnea Father   . Heart attack Father 68  . Heart attack Cousin    Social History:  reports that she quit smoking about 9 years ago. She does not have any  smokeless tobacco history on file. She reports that she does not drink alcohol or use illicit drugs.  Allergies:  Allergies  Allergen Reactions  . Lisinopril     cough   OUTPATIENT MEDICATIONS: No current facility-administered medications on file prior to encounter.   Current Outpatient Prescriptions on File Prior to Encounter  Medication Sig Dispense Refill  . aspirin 81 MG tablet Take 81 mg by mouth daily.    Marland Kitchen FLUoxetine (PROZAC) 40 MG capsule Take 40 mg by mouth daily.     . Insulin Degludec (TRESIBA FLEXTOUCH) 100 UNIT/ML SOPN Inject 46 Units into the skin every morning.     Marland Kitchen JARDIANCE 10 MG TABS tablet TK 1 T PO QD  6  . losartan-hydrochlorothiazide (HYZAAR) 50-12.5 MG per tablet     . metFORMIN (GLUCOPHAGE) 500 MG tablet Take 1,000 mg by mouth 2 (two) times daily with a meal.     . omeprazole (PRILOSEC) 20 MG capsule Take 20 mg by mouth 2 (two) times daily before a meal.     . TRULICITY 1.5 BX/0.3YB SOPN  INJ 1.5 MG Brock ONCE WEEKLY  6  . Vitamin D, Ergocalciferol, (DRISDOL) 50000 units CAPS capsule Take 50,000 Units by mouth every 7 (seven) days.       Results for orders placed or performed during the hospital encounter of 10/31/15 (from the past 48 hour(s))  CBC     Status: Abnormal   Collection Time: 10/31/15  1:40 PM  Result Value Ref Range   WBC 12.8 (H) 4.0 - 10.5 K/uL   RBC 4.95 3.87 - 5.11 MIL/uL   Hemoglobin 13.2 12.0 - 15.0 g/dL   HCT 40.1 36.0 - 46.0 %   MCV 81.0 78.0 - 100.0 fL   MCH 26.7 26.0 - 34.0 pg   MCHC 32.9 30.0 - 36.0 g/dL   RDW 14.0 11.5 - 15.5 %   Platelets 253 150 - 400 K/uL  Basic metabolic panel     Status: Abnormal   Collection Time: 10/31/15  1:40 PM  Result Value Ref Range   Sodium 137 135 - 145 mmol/L   Potassium 3.5 3.5 - 5.1 mmol/L   Chloride 104 101 - 111 mmol/L   CO2 18 (L) 22 - 32 mmol/L   Glucose, Bld 127 (H) 65 - 99 mg/dL   BUN 19 6 - 20 mg/dL   Creatinine, Ser 0.87 0.44 - 1.00 mg/dL   Calcium 10.3 8.9 - 10.3 mg/dL   GFR calc  non Af Amer >60 >60 mL/min   GFR calc Af Amer >60 >60 mL/min    Comment: (NOTE) The eGFR has been calculated using the CKD EPI equation. This calculation has not been validated in all clinical situations. eGFR's persistently <60 mL/min signify possible Chronic Kidney Disease.    Anion gap 15 5 - 15  CBG monitoring, ED     Status: Abnormal   Collection Time: 10/31/15  1:42 PM  Result Value Ref Range   Glucose-Capillary 120 (H) 65 - 99 mg/dL  I-stat troponin, ED (not at [facility], [facility])     Status: Abnormal   Collection Time: 10/31/15  2:04 PM  Result Value Ref Range   Troponin i, poc 0.57 (HH) 0.00 - 0.08 ng/mL   Comment NOTIFIED PHYSICIAN    Comment 3            Comment: Due to the release kinetics of cTnI, a negative result within the first hours of the onset of symptoms does not rule out myocardial infarction with certainty. If myocardial infarction is still suspected, repeat the test at appropriate intervals.   I-Stat Beta hCG blood, ED ([facility], [facility], [facility] only)     Status: None   Collection Time: 10/31/15  2:04 PM  Result Value Ref Range   I-stat hCG, quantitative <5.0 <5 mIU/mL   Comment 3            Comment:   GEST. AGE      CONC.  (mIU/mL)   <=1 WEEK        5 - 50     2 WEEKS       50 - 500     3 WEEKS       100 - 10,000     4 WEEKS     1,000 - 30,000        FEMALE AND NON-PREGNANT FEMALE:     LESS THAN 5 mIU/mL   Protime-INR     Status: None   Collection Time: 10/31/15  3:04 PM  Result Value Ref Range   Prothrombin Time 13.7 11.6 -  15.2 seconds   INR 1.03 0.00 - 1.49   Dg Chest Port 1 View  10/31/2015  CLINICAL DATA:  Tachycardia.  Dizziness.  Emotional stress. EXAM: PORTABLE CHEST 1 VIEW COMPARISON:  None. FINDINGS: The heart size and mediastinal contours are within normal limits. Both lungs are clear. The visualized skeletal structures are unremarkable. IMPRESSION: No active disease. Electronically Signed   By: Nelson Chimes M.D.   On: 10/31/2015 14:32    ROS: General:+  colds, PNA and bronchitis no recent fevers, no weight changes, decreased energy Skin:no rashes or ulcers HEENT:no blurred vision, no congestion CV:see HPI PUL:see HPI GI:no diarrhea constipation or melena, no indigestion GU:no hematuria, no dysuria MS:no joint pain, no claudication Neuro:no syncope, + lightheadedness today Endo:+ diabetes, no thyroid disease   Blood pressure 125/96, pulse 100, temperature 99.8 F (37.7 C), temperature source Rectal, resp. rate 13, SpO2 98 %.  Wt Readings from Last 3 Encounters:  10/06/15 224 lb (101.606 kg)  09/18/12 247 lb 6.4 oz (112.22 kg)  08/25/12 249 lb (112.946 kg)    PE: General:Pleasant affect, NAD Skin:Warm and dry, brisk capillary refill, sunburn on back.  HEENT:normocephalic, sclera clear, mucus membranes moist Neck:supple, no JVD, no bruits  Heart:S1S2 RRR at times and other times rapid without murmur, gallup, rub or click Lungs:clear without rales, rhonchi, or wheezes KZS:WFUX, non tender, + BS, do not palpate liver spleen or masses Ext:no lower ext edema, 2+ pedal pulses, 2+ radial pulses Neuro:alert and oriented X 3, MAE, follows commands, + facial symmetry   Assessment/Plan Principal Problem:   Atrial tachycardia (HCC) Active Problems:   IDDM (insulin dependent diabetes mellitus) (HCC)   Hypertension   Hyperlipemia   Obesity   GERD (gastroesophageal reflux disease)   Depression   Leukocytosis   Shingles  1. Tachycardia  Pt presented to [facility] with narrow complex tachycardia.  Responded with IV dilt  Now in SR with salvos of tachycardia (self limited).  Pt hemodynamically stable  This is the first time she felt this   This appears to by narrow complex ventricular tachycardia  Intermitt fusion beats present   I have reviewed with EP who will see pt in AM  .   For now would  d/c heparin Keep on dilt since she is on it and tolerating  Add low dose, regular with rapid rate.  Echo in AM Continue telemetry  No chest pain.     2.  Elevated trop  Prob represents demand ischemia in setting of tachycardia  Follow trends  Get echo  Further decisions based on this   3   HTN  Follow on current meds  4.  Shingles  Rx per primary team   5  DM  Per IM  6  HCM  CHeck lipids  Pt should be on statin wit DM   Cecilie Kicks  Nurse Practitioner Certified [place] Pager [phone] or after 5pm or weekends call [phone] 10/31/2015, 4:44 PM   Pt seen and examined  I have amended findings L [place] above to reflect my findings Pt comfortable  BP 120s/   Neck  JVP normal Lungs CTA Cardiac RRR   No S3  No murmurs  Abd  Shingles on L flank Ext No edema  Narrow complex VT  Plans as noted above Will follow.  Dorris Carnes

## 2015-10-31 NOTE — ED Notes (Signed)
Went to dr today because her heart was racing and she was dizzy, states she has been sick for 3 weeks with uri. 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

## 2015-10-31 NOTE — ED Notes (Signed)
Admitting at bedside 

## 2015-10-31 NOTE — ED Notes (Signed)
CBG 120 

## 2015-11-01 ENCOUNTER — Observation Stay (HOSPITAL_BASED_OUTPATIENT_CLINIC_OR_DEPARTMENT_OTHER): Payer: 59

## 2015-11-01 DIAGNOSIS — Z794 Long term (current) use of insulin: Secondary | ICD-10-CM | POA: Diagnosis not present

## 2015-11-01 DIAGNOSIS — F329 Major depressive disorder, single episode, unspecified: Secondary | ICD-10-CM | POA: Diagnosis not present

## 2015-11-01 DIAGNOSIS — I471 Supraventricular tachycardia: Secondary | ICD-10-CM | POA: Diagnosis not present

## 2015-11-01 DIAGNOSIS — I472 Ventricular tachycardia: Secondary | ICD-10-CM | POA: Diagnosis not present

## 2015-11-01 DIAGNOSIS — E785 Hyperlipidemia, unspecified: Secondary | ICD-10-CM | POA: Diagnosis not present

## 2015-11-01 DIAGNOSIS — R7989 Other specified abnormal findings of blood chemistry: Secondary | ICD-10-CM | POA: Insufficient documentation

## 2015-11-01 DIAGNOSIS — E119 Type 2 diabetes mellitus without complications: Secondary | ICD-10-CM | POA: Diagnosis not present

## 2015-11-01 DIAGNOSIS — R9431 Abnormal electrocardiogram [ECG] [EKG]: Secondary | ICD-10-CM | POA: Diagnosis not present

## 2015-11-01 DIAGNOSIS — R778 Other specified abnormalities of plasma proteins: Secondary | ICD-10-CM | POA: Insufficient documentation

## 2015-11-01 LAB — GLUCOSE, CAPILLARY
Glucose-Capillary: 169 mg/dL — ABNORMAL HIGH (ref 65–99)
Glucose-Capillary: 184 mg/dL — ABNORMAL HIGH (ref 65–99)
Glucose-Capillary: 202 mg/dL — ABNORMAL HIGH (ref 65–99)
Glucose-Capillary: 204 mg/dL — ABNORMAL HIGH (ref 65–99)
Glucose-Capillary: 216 mg/dL — ABNORMAL HIGH (ref 65–99)

## 2015-11-01 LAB — CBC
HCT: 34.3 % — ABNORMAL LOW (ref 36.0–46.0)
Hemoglobin: 10.7 g/dL — ABNORMAL LOW (ref 12.0–15.0)
MCH: 26.4 pg (ref 26.0–34.0)
MCHC: 31.2 g/dL (ref 30.0–36.0)
MCV: 84.5 fL (ref 78.0–100.0)
Platelets: 191 10*3/uL (ref 150–400)
RBC: 4.06 MIL/uL (ref 3.87–5.11)
RDW: 14.3 % (ref 11.5–15.5)
WBC: 7.5 10*3/uL (ref 4.0–10.5)

## 2015-11-01 LAB — COMPREHENSIVE METABOLIC PANEL
ALT: 11 U/L — ABNORMAL LOW (ref 14–54)
AST: 12 U/L — ABNORMAL LOW (ref 15–41)
Albumin: 2.8 g/dL — ABNORMAL LOW (ref 3.5–5.0)
Alkaline Phosphatase: 67 U/L (ref 38–126)
Anion gap: 9 (ref 5–15)
BUN: 16 mg/dL (ref 6–20)
CO2: 25 mmol/L (ref 22–32)
Calcium: 9.2 mg/dL (ref 8.9–10.3)
Chloride: 103 mmol/L (ref 101–111)
Creatinine, Ser: 1.02 mg/dL — ABNORMAL HIGH (ref 0.44–1.00)
GFR calc Af Amer: >60 mL/min (ref >60)
GFR calc non Af Amer: >60 mL/min (ref >60)
Glucose, Bld: 180 mg/dL — ABNORMAL HIGH (ref 65–99)
Potassium: 3.4 mmol/L — ABNORMAL LOW (ref 3.5–5.1)
Sodium: 137 mmol/L (ref 135–145)
Total Bilirubin: 0.5 mg/dL (ref 0.3–1.2)
Total Protein: 5.7 g/dL — ABNORMAL LOW (ref 6.5–8.1)

## 2015-11-01 LAB — LIPID PANEL
Cholesterol: 161 mg/dL (ref 0–200)
HDL: 32 mg/dL — ABNORMAL LOW (ref >40)
LDL Cholesterol: 74 mg/dL (ref 0–99)
Total CHOL/HDL Ratio: 5 RATIO
Triglycerides: 274 mg/dL — ABNORMAL HIGH (ref <150)
VLDL: 55 mg/dL — ABNORMAL HIGH (ref 0–40)

## 2015-11-01 LAB — TROPONIN I
Troponin I: 0.51 ng/mL (ref <0.031)
Troponin I: 0.46 ng/mL — ABNORMAL HIGH (ref <0.031)
Troponin I: 0.35 ng/mL — ABNORMAL HIGH (ref <0.031)

## 2015-11-01 LAB — ECHOCARDIOGRAM COMPLETE
Height: 65 in
Weight: 3488 oz

## 2015-11-01 LAB — HEMOGLOBIN A1C
Hgb A1c MFr Bld: 8.7 % — ABNORMAL HIGH (ref 4.8–5.6)
Mean Plasma Glucose: 203 mg/dL

## 2015-11-01 LAB — HEPARIN LEVEL (UNFRACTIONATED): Heparin Unfractionated: <0.1 IU/mL — ABNORMAL LOW (ref 0.30–0.70)

## 2015-11-01 LAB — D-DIMER, QUANTITATIVE: D-Dimer, Quant: <0.27 ug/mL-FEU (ref 0.00–0.50)

## 2015-11-01 LAB — MAGNESIUM: Magnesium: 1.8 mg/dL (ref 1.7–2.4)

## 2015-11-01 MED ORDER — DILTIAZEM HCL ER 60 MG PO CP12
60.0000 mg | ORAL_CAPSULE | Freq: Two times a day (BID) | ORAL | Status: DC
  Administered 2015-11-01 – 2015-11-02 (×3): 60 mg via ORAL
  Filled 2015-11-01 (×3): qty 1

## 2015-11-01 MED ORDER — POTASSIUM CHLORIDE CRYS ER 20 MEQ PO TBCR: 40.0000 meq | EXTENDED_RELEASE_TABLET | Freq: Once | ORAL | Status: DC

## 2015-11-01 MED ORDER — METOPROLOL TARTRATE 25 MG PO TABS
25.0000 mg | ORAL_TABLET | Freq: Two times a day (BID) | ORAL | Status: DC
  Administered 2015-11-01 – 2015-11-02 (×3): 25 mg via ORAL
  Filled 2015-11-01 (×3): qty 1

## 2015-11-01 MED ORDER — ATORVASTATIN CALCIUM 20 MG PO TABS
20.0000 mg | ORAL_TABLET | Freq: Every day | ORAL | Status: DC
  Administered 2015-11-01: 20 mg via ORAL
  Filled 2015-11-01: qty 1

## 2015-11-01 MED ORDER — POTASSIUM CHLORIDE CRYS ER 20 MEQ PO TBCR
40.0000 meq | EXTENDED_RELEASE_TABLET | Freq: Once | ORAL | Status: AC
  Administered 2015-11-01: 40 meq via ORAL
  Filled 2015-11-01: qty 2

## 2015-11-01 MED ORDER — ENOXAPARIN SODIUM 60 MG/0.6ML ~~LOC~~ SOLN
50.0000 mg | SUBCUTANEOUS | Status: DC
  Administered 2015-11-01: 50 mg via SUBCUTANEOUS
  Filled 2015-11-01: qty 0.6

## 2015-11-01 MED ORDER — INSULIN ASPART 100 UNIT/ML ~~LOC~~ SOLN
0.0000 [IU] | Freq: Three times a day (TID) | SUBCUTANEOUS | Status: DC
Start: 2015-11-02 — End: 2015-11-02
  Administered 2015-11-02: 3 [IU] via SUBCUTANEOUS

## 2015-11-01 NOTE — Progress Notes (Signed)
Inpatient Diabetes Program Recommendations  AACE/ADA: New Consensus Statement on Inpatient Glycemic Control (2015)  Target Ranges:  Prepandial:   less than 140 mg/dL      Peak postprandial:   less than 180 mg/dL (1-2 hours)      Critically ill patients:  140 - 180 mg/dL   Results for Catherine Hebert, Catherine Hebert (MRN [id number]) as of 11/01/2015 16:02  Ref. Range 10/31/2015 13:42 10/31/2015 18:33 10/31/2015 21:41 11/01/2015 00:17 11/01/2015 05:33 11/01/2015 10:57  Glucose-Capillary Latest Ref Range: 65-99 mg/dL 120 (H) 99 174 (H) 184 (H) 169 (H) 216 (H)   Results for Catherine Hebert, Catherine Hebert (MRN [id number]) as of 11/01/2015 16:02  Ref. Range 10/31/2015 18:44  Hemoglobin A1C Latest Ref Range: 4.8-5.6 % 8.7 (H)    Admit with: Atrial Tachycardia  History: DM2  Home DM Meds: Tresiba insulin- 46 units QAM       Metformin 1000 mg bid       Trulicity 1.5 mg Qweek       Jardiance 10 mg daily  Current Insulin Orders: Novolog Sensitive Correction Scale/ SSI (0-9 units) TID AC      MD- Please consider the following in-hospital insulin adjustments:  1. Start Levemir 15 units daily (1/3 total home dose)  2. Continue Novolog Sensitive Correction Scale/ SSI (0-9 units) TID AC   3. Start low dose Novolog Meal Coverage- Novolog 3 units tid with meals (pt eating 100% of meals)   DM Coordinator not present on campus over the weekend but can be reached by pager for questions regarding glycemic control.       --Will follow patient during hospitalization--  Wyn Quaker RN, MSN, CDE Diabetes Coordinator Inpatient Glycemic Control Team Team Pager: [phone] (8a-5p)

## 2015-11-01 NOTE — Consult Note (Signed)
Reason for Consult:narrow QRS tachycardia  Referring Physician: Dr. Ihor Gully Catherine Hebert is an 45 y.o. female.   HPI: The patient is a 45 yo woman with a h/o DM and HTN who was admitted with palpitations and found to have a narrow QRS tachycardia with VA dissociation. She has had palpitations for several months which start and stop suddenly and are associated with chest pressure and sob. She has not had syncope. She presented to the ED yesterday and was found to be in a narrow QRS tachy and was placed on IV cardizem and given IV metoprolol and her tachycardia has improved. The patient notes to have had the shingles as well.   PMH: Past Medical History  Diagnosis Date  . Diabetes mellitus without complication ([place])   . Hypertension   . Kidney stones   . Bursitis of elbow   . Esophageal reflux disease   . Diverticulosis   . Esophageal stricture   . GERD (gastroesophageal reflux disease)   . Hiatal hernia   . Ovarian cyst   . Fatigue     PSHX: Past Surgical History  Procedure Laterality Date  . Cesarean section  1998  . Cesarean section  2003  . Kidney stone removal  1993  . I&d back abscess  08/25/12    FAMHX: Family History  Problem Relation Age of Onset  . Diabetes Mother   . Heart attack Mother 49  . Diabetes Father   . Sleep apnea Father   . Heart attack Father 56  . Heart attack Cousin     Social History:  reports that she quit smoking about 9 years ago. She does not have any smokeless tobacco history on file. She reports that she does not drink alcohol or use illicit drugs.  Allergies:  Allergies  Allergen Reactions  . Lisinopril     cough    Medications: I have reviewed the patient's current medications.  Dg Chest Port 1 View  10/31/2015  CLINICAL DATA:  Tachycardia.  Dizziness.  Emotional stress. EXAM: PORTABLE CHEST 1 VIEW COMPARISON:  None. FINDINGS: The heart size and mediastinal contours are within normal limits. Both lungs are clear. The  visualized skeletal structures are unremarkable. IMPRESSION: No active disease. Electronically Signed   By: Nelson Chimes M.D.   On: 10/31/2015 14:32    ROS  As stated in the HPI and negative for all other systems.  Physical Exam  Vitals:Blood pressure 125/66, pulse 86, temperature 98.4 F (36.9 C), temperature source Oral, resp. rate 18, height 5\' 5"  (1.651 m), weight 218 lb (98.884 kg), SpO2 98 %.  Well appearing middle aged woman, NAD HEENT: Unremarkable Neck:  6 cm JVD, no thyromegally Lymphatics:  No adenopathy Back:  No CVA tenderness Lungs:  Clear with no wheezes HEART:  Regular rate rhythm, no murmurs, no rubs, no clicks Abd:  Flat, positive bowel sounds, no organomegally, no rebound, no guarding Ext:  2 plus pulses, no edema, no cyanosis, no clubbing Skin:  No rashes no nodules Neuro:  CN II through XII intact, motor grossly intact  Tele - nsr with PAC's and PVC's ECG - reviewed  Assessment/Plan: 1. Narrow QRS tachycardia - review of her ECG demonstrates VA dissociation making the mechanism of the tachycardia ventricular originating high in the conduction system (narrow QRS) vs junctional tachycardia with retrograde VA block. I favor the former diagnosis. As she has responded to calcium channel blocker and beta blockers, will stop the IV formulation and start the oral.  Would anticipate discharge home tomorrow. 2. HTN - her blood pressure is stable. Will use calcium blockade and beta blockers. 3. Obesity - she has been encouraged to lose weight.  Carleene Overlie TaylorMD 11/01/2015, 1:10 PM

## 2015-11-01 NOTE — Progress Notes (Addendum)
[facility] PROGRESS NOTE  Catherine Hebert [id number] DOB: [birth year] DOA: 10/31/2015   PCP: Geoffery Lyons, MD     Assessment/Plan: Principal Problem:   Atrial tachycardia ([place]) Active Problems:   IDDM (insulin dependent diabetes mellitus) (HCC)   Hypertension   Hyperlipemia   Obesity   GERD (gastroesophageal reflux disease)   Depression   Leukocytosis   Shingles   Elevated troponin   45 year old female we are asked to see for rapid HR and elevated troponins. She has no cardiac hx but + diabetes-IDDM, HTN, GERD and premature FH of CAD in mother in her 42s and father in 75s and cousin just died with MI at 48.Patient found to have atrial tachycardia with slightly abnormal troponin and placed on IV Cardizem drip.   Assessment and plan  Atrial tachycardia Currently on IV Cardizem drip, rate controlled with few PACs Initial troponin abnormal therefore continue to cycle cardiac enzymes,  D-dimer negative, TSH normal Appreciate further recommendations from cardiology Electrophysiology consultation this morning  Shingles-continue acyclovir, patient has had a rash for 1 week, will continue treatment for a total of 7 days  Hypertension-continue IV Lopressor and Cardizem,  Diabetes mellitus-and use sliding scale insulin hemoglobin A1c 8.7 Patient is on tresiba and jardiance at home Diabetes coordinator consult  Dyslipidemia-check lipid panel, start statin  Depression Continue Prozac  GERD Continue Protonix  DVT prophylaxsis heparin  Code Status:  Full code     Family Communication: Discussed in detail with the patient, all imaging results, lab results explained to the patient   Disposition Plan:  Continued on telemetry, may need a cardiac cath pending further decisions from cardiology      Consultants:  Cardiology  Electrophysiology    Procedures:  *2-D echo    Antibiotics: Anti-infectives    Start     Dose/Rate Route Frequency  Ordered Stop   10/31/15 1730  acyclovir (ZOVIRAX) 200 MG capsule 800 mg     800 mg Oral 5 times daily 10/31/15 1705           HPI/Subjective: Chest pain-free, denies any shortness of breath,  Objective: Filed Vitals:   10/31/15 2144 10/31/15 2354 11/01/15 0524 11/01/15 1038  BP: 114/57 122/88 120/66 120/69  Pulse: 95 92 81 81  Temp:  98.1 F (36.7 C) 89.5 F (31.9 C) 98.7 F (37.1 C)  TempSrc:  Oral Oral Oral  Resp: 18 18 18 18   Height:      Weight:   98.884 kg (218 lb)   SpO2:  97% 97% 99%    Intake/Output Summary (Last 24 hours) at 11/01/15 1057 Last data filed at 11/01/15 1012  Gross per 24 hour  Intake 1617.25 ml  Output    150 ml  Net 1467.25 ml    Exam:  Examination:  General exam: Appears calm and comfortable  Respiratory system: Clear to auscultation. Respiratory effort normal. Cardiovascular system: S1 & S2 heard, RRR. No JVD, murmurs, rubs, gallops or clicks. No pedal edema. Gastrointestinal system: Abdomen is nondistended, soft and nontender. No organomegaly or masses felt. Normal bowel sounds heard. Left flank has shingles Central nervous system: Alert and oriented. No focal neurological deficits. Extremities: Symmetric 5 x 5 power. Skin: No rashes, lesions or ulcers Psychiatry: Judgement and insight appear normal. Mood & affect appropriate.     Data Reviewed: I have personally reviewed following labs and imaging studies  Micro Results No results found for this or any previous visit (from the past  240 hour(s)).  Radiology Reports Dg Chest Port 1 View  10/31/2015  CLINICAL DATA:  Tachycardia.  Dizziness.  Emotional stress. EXAM: PORTABLE CHEST 1 VIEW COMPARISON:  None. FINDINGS: The heart size and mediastinal contours are within normal limits. Both lungs are clear. The visualized skeletal structures are unremarkable. IMPRESSION: No active disease. Electronically Signed   By: Nelson Chimes M.D.   On: 10/31/2015 14:32     CBC  Recent Labs Lab  10/31/15 1340 11/01/15 0037  WBC 12.8* 7.5  HGB 13.2 10.7*  HCT 40.1 34.3*  PLT 253 191  MCV 81.0 84.5  MCH 26.7 26.4  MCHC 32.9 31.2  RDW 14.0 14.3    Chemistries   Recent Labs Lab 10/31/15 1340 11/01/15 0037 11/01/15 0959  NA 137 137  --   K 3.5 3.4*  --   CL 104 103  --   CO2 18* 25  --   GLUCOSE 127* 180*  --   BUN 19 16  --   CREATININE 0.87 1.02*  --   CALCIUM 10.3 9.2  --   MG  --   --  1.8  AST  --  12*  --   ALT  --  11*  --   ALKPHOS  --  67  --   BILITOT  --  0.5  --    ------------------------------------------------------------------------------------------------------------------ estimated creatinine clearance is 81.1 mL/min (by C-G formula based on Cr of 1.02). ------------------------------------------------------------------------------------------------------------------  Recent Labs  10/31/15 1844  HGBA1C 8.7*   ------------------------------------------------------------------------------------------------------------------ No results for input(s): CHOL, HDL, LDLCALC, TRIG, CHOLHDL, LDLDIRECT in the last 72 hours. ------------------------------------------------------------------------------------------------------------------  Recent Labs  10/31/15 1844  TSH 2.099   ------------------------------------------------------------------------------------------------------------------ No results for input(s): VITAMINB12, FOLATE, FERRITIN, TIBC, IRON, RETICCTPCT in the last 72 hours.  Coagulation profile  Recent Labs Lab 10/31/15 1504  INR 1.03     Recent Labs  11/01/15 0959  DDIMER <0.27    Cardiac Enzymes No results for input(s): CKMB, TROPONINI, MYOGLOBIN in the last 168 hours.  Invalid input(s): CK ------------------------------------------------------------------------------------------------------------------ Invalid input(s): POCBNP   CBG:  Recent Labs Lab 10/31/15 1342 10/31/15 1833 10/31/15 2141 11/01/15 0017  11/01/15 0533  GLUCAP 120* 99 174* 184* 169*       Studies: Dg Chest Port 1 View  10/31/2015  CLINICAL DATA:  Tachycardia.  Dizziness.  Emotional stress. EXAM: PORTABLE CHEST 1 VIEW COMPARISON:  None. FINDINGS: The heart size and mediastinal contours are within normal limits. Both lungs are clear. The visualized skeletal structures are unremarkable. IMPRESSION: No active disease. Electronically Signed   By: Nelson Chimes M.D.   On: 10/31/2015 14:32      Lab Results  Component Value Date   HGBA1C 8.7* 10/31/2015   Lab Results  Component Value Date   CREATININE 1.02* 11/01/2015       Scheduled Meds: . acyclovir  800 mg Oral 5 X Daily  . aspirin EC  81 mg Oral Daily  . FLUoxetine  40 mg Oral Daily  . heparin  4,000 Units Intravenous Once  . insulin aspart  0-9 Units Subcutaneous TID WC  . metoprolol  5 mg Intravenous Q4H  . pantoprazole  40 mg Oral Daily  . potassium chloride  40 mEq Oral Once  . sodium chloride flush  3 mL Intravenous Q12H   Continuous Infusions: . sodium chloride 75 mL/hr at 10/31/15 2151  . diltiazem (CARDIZEM) infusion 5 mg/hr (11/01/15 0108)        Time spent: >30 MINS  [facility] Pager [phone]. If 7PM-7AM, please contact night-coverage at [email or web address], password [facility] 11/01/2015, 10:57 AM

## 2015-11-01 NOTE — Progress Notes (Signed)
  Echocardiogram Echocardiogram Transesophageal has been performed.  Donata Clay 11/01/2015, 9:01 AM

## 2015-11-02 ENCOUNTER — Other Ambulatory Visit: Payer: Self-pay | Admitting: Nurse Practitioner

## 2015-11-02 DIAGNOSIS — I472 Ventricular tachycardia, unspecified: Secondary | ICD-10-CM

## 2015-11-02 DIAGNOSIS — I471 Supraventricular tachycardia: Secondary | ICD-10-CM | POA: Diagnosis not present

## 2015-11-02 LAB — COMPREHENSIVE METABOLIC PANEL
ALT: 19 U/L (ref 14–54)
AST: 12 U/L — ABNORMAL LOW (ref 15–41)
Albumin: 2.7 g/dL — ABNORMAL LOW (ref 3.5–5.0)
Alkaline Phosphatase: 71 U/L (ref 38–126)
Anion gap: 7 (ref 5–15)
BUN: 15 mg/dL (ref 6–20)
CO2: 25 mmol/L (ref 22–32)
Calcium: 9 mg/dL (ref 8.9–10.3)
Chloride: 107 mmol/L (ref 101–111)
Creatinine, Ser: 0.91 mg/dL (ref 0.44–1.00)
GFR calc Af Amer: >60 mL/min (ref >60)
GFR calc non Af Amer: >60 mL/min (ref >60)
Glucose, Bld: 234 mg/dL — ABNORMAL HIGH (ref 65–99)
Potassium: 4.2 mmol/L (ref 3.5–5.1)
Sodium: 139 mmol/L (ref 135–145)
Total Bilirubin: 0.3 mg/dL (ref 0.3–1.2)
Total Protein: 5.6 g/dL — ABNORMAL LOW (ref 6.5–8.1)

## 2015-11-02 LAB — CBC
HCT: 33.7 % — ABNORMAL LOW (ref 36.0–46.0)
Hemoglobin: 10.6 g/dL — ABNORMAL LOW (ref 12.0–15.0)
MCH: 26.4 pg (ref 26.0–34.0)
MCHC: 31.5 g/dL (ref 30.0–36.0)
MCV: 84 fL (ref 78.0–100.0)
Platelets: 185 10*3/uL (ref 150–400)
RBC: 4.01 MIL/uL (ref 3.87–5.11)
RDW: 14.4 % (ref 11.5–15.5)
WBC: 6.2 10*3/uL (ref 4.0–10.5)

## 2015-11-02 LAB — GLUCOSE, CAPILLARY
Glucose-Capillary: 226 mg/dL — ABNORMAL HIGH (ref 65–99)
Glucose-Capillary: 234 mg/dL — ABNORMAL HIGH (ref 65–99)

## 2015-11-02 MED ORDER — HYDROCODONE-ACETAMINOPHEN 5-325 MG PO TABS: 1.0000 | ORAL_TABLET | ORAL | Status: DC | PRN

## 2015-11-02 MED ORDER — ACYCLOVIR 200 MG PO CAPS: 800.0000 mg | ORAL_CAPSULE | Freq: Every day | ORAL | Status: AC

## 2015-11-02 MED ORDER — SENNOSIDES-DOCUSATE SODIUM 8.6-50 MG PO TABS: 1.0000 | ORAL_TABLET | Freq: Every evening | ORAL | Status: DC | PRN

## 2015-11-02 MED ORDER — METOPROLOL TARTRATE 25 MG PO TABS: 25.0000 mg | ORAL_TABLET | Freq: Two times a day (BID) | ORAL | Status: DC

## 2015-11-02 MED ORDER — DILTIAZEM HCL ER 60 MG PO CP12: 60.0000 mg | ORAL_CAPSULE | Freq: Two times a day (BID) | ORAL | Status: DC

## 2015-11-02 MED ORDER — ATORVASTATIN CALCIUM 20 MG PO TABS: 20.0000 mg | ORAL_TABLET | Freq: Every day | ORAL | Status: DC

## 2015-11-02 NOTE — Discharge Summary (Signed)
Discharge Summary  Catherine Hebert [id number] DOB: 04-Nov-1970  PCP: Geoffery Lyons, MD  Admit date: 10/31/2015 Discharge date: 11/02/2015   Recommendations for Outpatient Follow-up:  1. Dr. Cristopher [facility] in 3-4 weeks. Will likely need Myoview scan as outpatient.   Discharge Diagnoses:  Active Hospital Problems   Diagnosis Date Noted  . Atrial tachycardia ([facility]) 10/31/2015  . Elevated troponin   . IDDM (insulin dependent diabetes mellitus) ([place]) 10/31/2015  . Hypertension 10/31/2015  . Hyperlipemia 10/31/2015  . Obesity 10/31/2015  . GERD (gastroesophageal reflux disease) 10/31/2015  . Depression 10/31/2015  . Leukocytosis 10/31/2015  . Shingles 10/31/2015    Resolved Hospital Problems   Diagnosis Date Noted Date Resolved  No resolved problems to display.    Discharge Condition: Stable   Diet recommendation: Cardiac   Filed Vitals:   11/01/15 2215 11/02/15 0625  BP: 132/77 129/76  Pulse: 76 77  Temp: 98.5 F (36.9 C) 98.4 F (36.9 C)  Resp: 18 18    History of present illness:  45 year old female admitted for for rapid HR and elevated troponins. She has no cardiac hx but + diabetes-IDDM, HTN, GERD and premature FH of CAD in mother in her 57s and father in 33s and cousin just died with MI at 33.Patient found to have atrial tachycardia with slightly abnormal troponin and placed on IV Cardizem drip. Was seen by cardiology, and had ECHO on 6/3. The estimated ejection fraction was in the range of 55% to 60%. Akinesis of the basalinferior myocardium was noted. She was started on PO diltiazem as well as metoprolol. Doing well and cleared for discharge home today by cardiology.  Hospital Course:  Principal Problem:   Atrial tachycardia (H[facility]) Active Problems:   IDDM (insulin dependent diabetes mellitus) (HCC)   Hypertension   Hyperlipemia   Obesity   GERD (gastroesophageal reflux disease)   Depression   Leukocytosis   Shingles   Elevated  troponin   Procedures:  6/3 Echo   Consultations:  Cardiology   Discharge Exam: BP 129/76 mmHg  Pulse 77  Temp(Src) 98.4 F (36.9 C) (Oral)  Resp 18  Ht 5\' 5"  (1.651 m)  Wt 100.1 kg (220 lb 10.9 oz)  BMI 36.72 kg/m2  SpO2 99% General:  Alert, oriented, calm, in no acute distress  Eyes: pupils round and reactive to light and accomodation, clear sclerea Neck: supple, no masses, trachea mildline  Cardiovascular: RRR, no murmurs or rubs, no peripheral edema  Respiratory: clear to auscultation bilaterally, no wheezes, no crackles  Abdomen: soft, nontender, nondistended, normal bowel tones heard  Skin: dry, no rashes  Musculoskeletal: no joint effusions, normal range of motion  Psychiatric: appropriate affect, normal speech  Neurologic: extraocular muscles intact, clear speech, moving all extremities with intact sensorium    Discharge Instructions You were cared for by a hospitalist during your hospital stay. If you have any questions about your discharge medications or the care you received while you were in the hospital after you are discharged, you can call the unit and asked to speak with the hospitalist on call if the hospitalist that took care of you is not available. Once you are discharged, your primary care physician will handle any further medical issues. Please note that NO REFILLS for any discharge medications will be authorized once you are discharged, as it is imperative that you return to your primary care physician (or establish a relationship with a primary care physician if you do not have one) for your aftercare  needs so that they can reassess your need for medications and monitor your lab values.  Discharge Instructions    Amb referral to [facility]    Complete by:  As directed      Diet - low sodium heart healthy    Complete by:  As directed      Discharge instructions    Complete by:  As directed   Return to work on 11/05/15.  F/u with EP as directed.      Increase activity slowly    Complete by:  As directed             Medication List    STOP taking these medications        losartan-hydrochlorothiazide 50-12.5 MG tablet  Commonly known as:  HYZAAR      TAKE these medications        acyclovir 200 MG capsule  Commonly known as:  ZOVIRAX  Take 4 capsules (800 mg total) by mouth 5 (five) times daily.     aspirin 81 MG tablet  Take 81 mg by mouth daily.     atorvastatin 20 MG tablet  Commonly known as:  LIPITOR  Take 1 tablet (20 mg total) by mouth daily at 6 PM.     diltiazem 60 MG 12 hr capsule  Commonly known as:  CARDIZEM SR  Take 1 capsule (60 mg total) by mouth every 12 (twelve) hours.     FLUoxetine 40 MG capsule  Commonly known as:  PROZAC  Take 40 mg by mouth daily.     HYDROcodone-acetaminophen 5-325 MG tablet  Commonly known as:  NORCO/VICODIN  Take 1-2 tablets by mouth every 4 (four) hours as needed for moderate pain.     JARDIANCE 10 MG Tabs tablet  Generic drug:  empagliflozin  TK 1 T PO QD     levofloxacin 500 MG tablet  Commonly known as:  LEVAQUIN  Take 500 mg by mouth daily. For 7 days     metFORMIN 500 MG tablet  Commonly known as:  GLUCOPHAGE  Take 1,000 mg by mouth 2 (two) times daily with a meal.     metoprolol tartrate 25 MG tablet  Commonly known as:  LOPRESSOR  Take 1 tablet (25 mg total) by mouth 2 (two) times daily.     omeprazole 20 MG capsule  Commonly known as:  PRILOSEC  Take 20 mg by mouth 2 (two) times daily before a meal.     senna-docusate 8.6-50 MG tablet  Commonly known as:  Senokot-S  Take 1 tablet by mouth at bedtime as needed for mild constipation.     TRESIBA FLEXTOUCH 100 UNIT/ML Sopn  Generic drug:  Insulin Degludec  Inject 46 Units into the skin every morning.     TRULICITY 1.5 0000000 Sopn  Generic drug:  Dulaglutide  INJ 1.5 MG [place] ONCE WEEKLY     Vitamin D (Ergocalciferol) 50000 units Caps capsule  Commonly known as:  DRISDOL  Take 50,000 Units by mouth  every 7 (seven) days.       Allergies  Allergen Reactions  . Lisinopril     cough       Follow-up Information    Follow up with Cristopher Peru, MD.   Specialty:  Cardiology   Why:  we will arrange for follow-up stress testing and office follow-up with Dr. Lovena Le and contact you.   Contact information:   [id number] [place] [address] [phone]  The results of significant diagnostics from this hospitalization (including imaging, microbiology, ancillary and laboratory) are listed below for reference.    Significant Diagnostic Studies: Dg Chest Port 1 View  10/31/2015  CLINICAL DATA:  Tachycardia.  Dizziness.  Emotional stress. EXAM: PORTABLE CHEST 1 VIEW COMPARISON:  None. FINDINGS: The heart size and mediastinal contours are within normal limits. Both lungs are clear. The visualized skeletal structures are unremarkable. IMPRESSION: No active disease. Electronically Signed   By: Nelson Chimes M.D.   On: 10/31/2015 14:32    Microbiology: No results found for this or any previous visit (from the past 240 hour(s)).   Labs: Basic Metabolic Panel:  Recent Labs Lab 10/31/15 1340 11/01/15 0037 11/01/15 0959 11/02/15 0353  NA 137 137  --  139  K 3.5 3.4*  --  4.2  CL 104 103  --  107  CO2 18* 25  --  25  GLUCOSE 127* 180*  --  234*  BUN 19 16  --  15  CREATININE 0.87 1.02*  --  0.91  CALCIUM 10.3 9.2  --  9.0  MG  --   --  1.8  --    Liver Function Tests:  Recent Labs Lab 11/01/15 0037 11/02/15 0353  AST 12* 12*  ALT 11* 19  ALKPHOS 67 71  BILITOT 0.5 0.3  PROT 5.7* 5.6*  ALBUMIN 2.8* 2.7*   No results for input(s): LIPASE, AMYLASE in the last 168 hours. No results for input(s): AMMONIA in the last 168 hours. CBC:  Recent Labs Lab 10/31/15 1340 11/01/15 0037 11/02/15 0353  WBC 12.8* 7.5 6.2  HGB 13.2 10.7* 10.6*  HCT 40.1 34.3* 33.7*  MCV 81.0 84.5 84.0  PLT 253 191 185   Cardiac Enzymes:  Recent Labs Lab  11/01/15 1000 11/01/15 1619 11/01/15 2209  TROPONINI 0.51* 0.46* 0.35*   BNP: BNP (last 3 results)  Recent Labs  10/31/15 1844  BNP 548.1*    ProBNP (last 3 results) No results for input(s): PROBNP in the last 8760 hours.  CBG:  Recent Labs Lab 11/01/15 0533 11/01/15 1057 11/01/15 1632 11/01/15 2217 11/02/15 0607  GLUCAP 169* 216* 202* 204* 226*    Time spent: 42 minutes were spent in preparing this discharge including medication reconciliation, counseling, and coordination of care.  Signed:  Atleigh Gruen [employer]  [facility] 11/02/2015, 11:35 AM

## 2015-11-02 NOTE — Progress Notes (Signed)
OT Cancellation Note  Patient Details Name: Catherine Hebert MRN: [id number] DOB: Mar 18, 1971   Cancelled Treatment:    Reason Eval/Treat Not Completed: Other (comment) (No OT order needed. Per PA pt needs PT order. )  Hortencia Pilar 11/02/2015, 11:08 AM

## 2015-11-02 NOTE — Progress Notes (Signed)
Patient ID: Catherine Hebert, female   DOB: [birth year], 45 y.o.   MRN: [id number]    Patient Name: Catherine Hebert Date of Encounter: 11/02/2015     Principal Problem:   Atrial tachycardia ([facility]) Active Problems:   IDDM (insulin dependent diabetes mellitus) (H[facility])   Hypertension   Hyperlipemia   Obesity   GERD (gastroesophageal reflux disease)   Depression   Leukocytosis   Shingles   Elevated troponin    SUBJECTIVE  No chest pain or sob.  CURRENT MEDS . acyclovir  800 mg Oral 5 X Daily  . aspirin EC  81 mg Oral Daily  . atorvastatin  20 mg Oral q1800  . diltiazem  60 mg Oral Q12H  . enoxaparin (LOVENOX) injection  50 mg Subcutaneous Q24H  . FLUoxetine  40 mg Oral Daily  . insulin aspart  0-9 Units Subcutaneous TID WC  . metoprolol tartrate  25 mg Oral BID  . pantoprazole  40 mg Oral Daily  . sodium chloride flush  3 mL Intravenous Q12H    OBJECTIVE  Filed Vitals:   11/01/15 1038 11/01/15 1240 11/01/15 2215 11/02/15 0625  BP: 120/69 125/66 132/77 129/76  Pulse: 81 86 76 77  Temp: 98.7 F (37.1 C) 98.4 F (36.9 C) 98.5 F (36.9 C) 98.4 F (36.9 C)  TempSrc: Oral Oral Oral Oral  Resp: 18 18 18 18   Height:      Weight:    220 lb 10.9 oz (100.1 kg)  SpO2: 99% 98% 98% 99%    Intake/Output Summary (Last 24 hours) at 11/02/15 1104 Last data filed at 11/02/15 0905  Gross per 24 hour  Intake    942 ml  Output   1200 ml  Net   -258 ml   Filed Weights   10/31/15 1813 11/01/15 0524 11/02/15 0625  Weight: 215 lb 9.6 oz (97.796 kg) 218 lb (98.884 kg) 220 lb 10.9 oz (100.1 kg)    PHYSICAL EXAM  General: Pleasant, NAD. Neuro: Alert and oriented X 3. Moves all extremities spontaneously. Psych: Normal affect. HEENT:  Normal  Neck: Supple without bruits or JVD. Lungs:  Resp regular and unlabored, CTA. Heart: RRR no s3, s4, or murmurs. Abdomen: Soft, non-tender, non-distended, BS + x 4.  Extremities: No clubbing, cyanosis or edema. DP/PT/Radials 2+ and equal  bilaterally.  Accessory Clinical Findings  CBC  Recent Labs  11/01/15 0037 11/02/15 0353  WBC 7.5 6.2  HGB 10.7* 10.6*  HCT 34.3* 33.7*  MCV 84.5 84.0  PLT 191 [id number]   Basic Metabolic Panel  Recent Labs  11/01/15 0037 11/01/15 0959 11/02/15 0353  NA 137  --  139  K 3.4*  --  4.2  CL 103  --  107  CO2 25  --  25  GLUCOSE 180*  --  234*  BUN 16  --  15  CREATININE 1.02*  --  0.91  CALCIUM 9.2  --  9.0  MG  --  1.8  --    Liver Function Tests  Recent Labs  11/01/15 0037 11/02/15 0353  AST 12* 12*  ALT 11* 19  ALKPHOS 67 71  BILITOT 0.5 0.3  PROT 5.7* 5.6*  ALBUMIN 2.8* 2.7*   No results for input(s): LIPASE, AMYLASE in the last 72 hours. Cardiac Enzymes  Recent Labs  11/01/15 1000 11/01/15 1619 11/01/15 2209  TROPONINI 0.51* 0.46* 0.35*   BNP Invalid input(s): POCBNP D-Dimer  Recent Labs  11/01/15 0959  DDIMER <0.27   Hemoglobin A1C  Recent Labs  10/31/15 1844  HGBA1C 8.7*   Fasting Lipid Panel  Recent Labs  11/01/15 1108  CHOL 161  HDL 32*  LDLCALC 74  TRIG 274*  CHOLHDL 5.0   Thyroid Function Tests  Recent Labs  10/31/15 1844  TSH 2.099    TELE  nsr   Radiology/Studies  Dg Chest Port 1 View  10/31/2015  CLINICAL DATA:  Tachycardia.  Dizziness.  Emotional stress. EXAM: PORTABLE CHEST 1 VIEW COMPARISON:  None. FINDINGS: The heart size and mediastinal contours are within normal limits. Both lungs are clear. The visualized skeletal structures are unremarkable. IMPRESSION: No active disease. Electronically Signed   By: Nelson Chimes M.D.   On: 10/31/2015 14:32    ASSESSMENT AND PLAN  1. VT arising from high in the ventricular septum near the his bundle - she has had no recurrent symptoms. She has normal LV function and inferobasal wall motion. Will not need heart cath but will plan and outpatient myoview scan. 2. Dyspnea - this has resolved.  3. HTN - her pressures are stable. Will follow.  4. Disp. - she can be  discharged home on Diltiazem and metoprolol at current dosing. Outpatient myoview. Followup with me in 3-4 weeks. With shingles, probably should wait 2-3 days before going back to work.  Khale Nigh,M.D.  11/02/2015 11:04 AM

## 2015-11-02 NOTE — Progress Notes (Signed)
Pt discharged home, IV and tele removed. Discharge instructions given questions answered, Prescriptions given to patient.

## 2015-11-06 ENCOUNTER — Telehealth: Payer: Self-pay | Admitting: Neurology

## 2015-11-06 DIAGNOSIS — R51 Headache: Secondary | ICD-10-CM

## 2015-11-06 DIAGNOSIS — R519 Headache, unspecified: Secondary | ICD-10-CM

## 2015-11-06 DIAGNOSIS — G4710 Hypersomnia, unspecified: Secondary | ICD-10-CM

## 2015-11-06 DIAGNOSIS — E669 Obesity, unspecified: Secondary | ICD-10-CM

## 2015-11-06 DIAGNOSIS — R0681 Apnea, not elsewhere classified: Secondary | ICD-10-CM

## 2015-11-06 DIAGNOSIS — R0683 Snoring: Secondary | ICD-10-CM

## 2015-11-06 DIAGNOSIS — R351 Nocturia: Secondary | ICD-10-CM

## 2015-11-06 NOTE — Telephone Encounter (Signed)
Order is in.

## 2015-11-06 NOTE — Telephone Encounter (Signed)
[employer] denied Split sleep study however approved HST.  Can I get an order for HST?

## 2015-11-10 ENCOUNTER — Telehealth (HOSPITAL_COMMUNITY): Payer: Self-pay | Admitting: *Deleted

## 2015-11-10 NOTE — Telephone Encounter (Signed)
Patient given detailed instructions per Myocardial Perfusion Study Information Sheet for the test on 11/13/15 Patient notified to arrive 15 minutes early and that it is imperative to arrive on time for appointment to keep from having the test rescheduled.  If you need to cancel or reschedule your appointment, please call the office within 24 hours of your appointment. Failure to do so may result in a cancellation of your appointment, and a $50 no show fee. Patient verbalized understanding. Hubbard Robinson, RN

## 2015-11-11 ENCOUNTER — Encounter (INDEPENDENT_AMBULATORY_CARE_PROVIDER_SITE_OTHER): Payer: 59 | Admitting: Neurology

## 2015-11-11 DIAGNOSIS — G4710 Hypersomnia, unspecified: Secondary | ICD-10-CM | POA: Diagnosis not present

## 2015-11-11 DIAGNOSIS — R0681 Apnea, not elsewhere classified: Secondary | ICD-10-CM

## 2015-11-11 DIAGNOSIS — E669 Obesity, unspecified: Secondary | ICD-10-CM

## 2015-11-11 DIAGNOSIS — R51 Headache: Secondary | ICD-10-CM

## 2015-11-11 DIAGNOSIS — R351 Nocturia: Secondary | ICD-10-CM

## 2015-11-11 DIAGNOSIS — R0683 Snoring: Secondary | ICD-10-CM

## 2015-11-11 DIAGNOSIS — R519 Headache, unspecified: Secondary | ICD-10-CM

## 2015-11-13 ENCOUNTER — Ambulatory Visit (HOSPITAL_COMMUNITY): Payer: 59 | Attending: Cardiovascular Disease

## 2015-11-13 DIAGNOSIS — Z8249 Family history of ischemic heart disease and other diseases of the circulatory system: Secondary | ICD-10-CM | POA: Diagnosis not present

## 2015-11-13 DIAGNOSIS — I472 Ventricular tachycardia, unspecified: Secondary | ICD-10-CM

## 2015-11-13 DIAGNOSIS — R9439 Abnormal result of other cardiovascular function study: Secondary | ICD-10-CM | POA: Diagnosis not present

## 2015-11-13 DIAGNOSIS — I10 Essential (primary) hypertension: Secondary | ICD-10-CM | POA: Diagnosis not present

## 2015-11-13 DIAGNOSIS — R0602 Shortness of breath: Secondary | ICD-10-CM | POA: Insufficient documentation

## 2015-11-13 DIAGNOSIS — R0609 Other forms of dyspnea: Secondary | ICD-10-CM | POA: Diagnosis not present

## 2015-11-13 DIAGNOSIS — E119 Type 2 diabetes mellitus without complications: Secondary | ICD-10-CM | POA: Insufficient documentation

## 2015-11-13 DIAGNOSIS — R002 Palpitations: Secondary | ICD-10-CM | POA: Insufficient documentation

## 2015-11-13 LAB — MYOCARDIAL PERFUSION IMAGING
LV dias vol: 119 mL (ref 46–106)
LV sys vol: 68 mL
Peak HR: 93 {beats}/min
RATE: 0.35
Rest HR: 80 {beats}/min
SDS: 5
SRS: 19
SSS: 24
TID: 1.19

## 2015-11-13 MED ORDER — REGADENOSON 0.4 MG/5ML IV SOLN
0.4000 mg | Freq: Once | INTRAVENOUS | Status: AC
  Administered 2015-11-13: 0.4 mg via INTRAVENOUS

## 2015-11-13 MED ORDER — TECHNETIUM TC 99M TETROFOSMIN IV KIT
32.8000 | PACK | Freq: Once | INTRAVENOUS | Status: AC | PRN
  Administered 2015-11-13: 32.8 via INTRAVENOUS
  Filled 2015-11-13: qty 33

## 2015-11-13 MED ORDER — TECHNETIUM TC 99M TETROFOSMIN IV KIT
11.0000 | PACK | Freq: Once | INTRAVENOUS | Status: AC | PRN
  Administered 2015-11-13: 11 via INTRAVENOUS
  Filled 2015-11-13: qty 11

## 2015-11-14 ENCOUNTER — Telehealth: Payer: Self-pay | Admitting: Neurology

## 2015-11-14 DIAGNOSIS — G4733 Obstructive sleep apnea (adult) (pediatric): Secondary | ICD-10-CM

## 2015-11-14 NOTE — Telephone Encounter (Signed)
Patient referred by Reynaldo Minium, seen by me on 10/06/15, HST on 11/11/15:  Please call and notify the patient that the recent home sleep test did suggest the diagnosis of near moderate obstructive sleep apnea and that I recommend treatment for this in the form of CPAP. I will request an overnight sleep study for proper titration and mask fitting. Please explain to patient and arrange for a CPAP titration study. I have placed an order in the chart. Thanks, and please route to [facility] for scheduling.   Star Age, MD, PhD [facility])

## 2015-11-17 NOTE — Telephone Encounter (Signed)
Called cell but vm full.  LM on home number.

## 2015-11-17 NOTE — Telephone Encounter (Signed)
Patient is aware of results and recommendations. She is willing to proceed with titration study.

## 2015-11-18 ENCOUNTER — Telehealth: Payer: Self-pay | Admitting: Neurology

## 2015-11-18 DIAGNOSIS — G4733 Obstructive sleep apnea (adult) (pediatric): Secondary | ICD-10-CM

## 2015-11-18 NOTE — Telephone Encounter (Signed)
We will set patient up with autoPAP at home, as insurance denied in house titration study for OSA. Pls process order and notify patient and set up FU in 8-10 weeks.

## 2015-11-18 NOTE — Telephone Encounter (Signed)
Ok for order?  

## 2015-11-18 NOTE — Telephone Encounter (Signed)
[employer] denied CPAP suggests auto pap

## 2015-11-19 NOTE — Telephone Encounter (Signed)
I spoke to patient and she is aware of the process below. I will send orders to [facility]. I will also send the patient a letter reminding her to make f/u appt and stress the importance of compliance.

## 2015-11-28 ENCOUNTER — Ambulatory Visit (INDEPENDENT_AMBULATORY_CARE_PROVIDER_SITE_OTHER): Payer: 59 | Admitting: Nurse Practitioner

## 2015-11-28 ENCOUNTER — Encounter: Payer: Self-pay | Admitting: Nurse Practitioner

## 2015-11-28 VITALS — BP 122/80 | HR 94 | Ht 66.0 in | Wt 218.2 lb

## 2015-11-28 DIAGNOSIS — I472 Ventricular tachycardia, unspecified: Secondary | ICD-10-CM

## 2015-11-28 DIAGNOSIS — I10 Essential (primary) hypertension: Secondary | ICD-10-CM

## 2015-11-28 DIAGNOSIS — G4733 Obstructive sleep apnea (adult) (pediatric): Secondary | ICD-10-CM

## 2015-11-28 MED ORDER — DILTIAZEM HCL ER COATED BEADS 120 MG PO CP24: 120.0000 mg | ORAL_CAPSULE | Freq: Every day | ORAL | Status: DC

## 2015-11-28 MED ORDER — METOPROLOL SUCCINATE ER 25 MG PO TB24: 25.0000 mg | ORAL_TABLET | Freq: Every day | ORAL | Status: DC

## 2015-11-28 NOTE — Patient Instructions (Signed)
Medication Instructions:   START TAKING CARDIZEM 120 MG ONCE A DAY   START TAKING METOPROLOL XL TWICE A DAY   If you need a refill on your cardiac medications before your next appointment, please call your pharmacy.  Labwork: NONE ORDER TODAY    Testing/Procedures: NONE ORDER TODAY    Follow-Up:  IN 3 MONTH WITH DR Lovena Le    Any Other Special Instructions Will Be Listed Below (If Applicable).

## 2015-11-28 NOTE — Progress Notes (Signed)
Electrophysiology Office Note Date: 11/28/2015  ID:  EVILYN KAZARIAN, DOB 07-19-1970, MRN [id number]  PCP: Geoffery Lyons, MD  Cardiologist: Harrington Challenger  Electrophysiologist: Lovena Le  CC: hospitalization follow up  Catherine Hebert is a 45 y.o. female seen today for Dr Lovena Le.  She was recently admitted with palpitations and found to have VT arising near His that responded to CCB.  Echo demonstrated normal EF with mild MR.  Outpatient myoview demonstrated no ischemia; possible prior infarct.  Dr Lovena Le reviewed and felt in the absence of ischemic symptoms, no further work up necessary at this time. Since discharge, she has not had recurrent palpitations.  She presents today for routine electrophysiology followup.  Her main complaint is fatigue. She has been diagnosed with OSA and is pending CPAP titration.  She denies chest pain, palpitations, dyspnea, PND, orthopnea, nausea, vomiting, dizziness, syncope, edema, weight gain, or early satiety.  Past Medical History  Diagnosis Date  . Diabetes mellitus without complication ([place])   . Hypertension   . Kidney stones   . Bursitis of elbow   . Esophageal reflux disease   . Diverticulosis   . Esophageal stricture   . GERD (gastroesophageal reflux disease)   . Hiatal hernia   . Ovarian cyst   . Fatigue    Past Surgical History  Procedure Laterality Date  . Cesarean section  1998  . Cesarean section  2003  . Kidney stone removal  1993  . I&d back abscess  08/25/12    Current Outpatient Prescriptions  Medication Sig Dispense Refill  . aspirin 81 MG tablet Take 81 mg by mouth daily.    Marland Kitchen atorvastatin (LIPITOR) 20 MG tablet Take 1 tablet (20 mg total) by mouth daily at 6 PM. 30 tablet 0  . Cholecalciferol (VITAMIN D3) 50000 units CAPS Take 1 tablet by mouth once a week.  2  . diltiazem (CARDIZEM SR) 60 MG 12 hr capsule Take 1 capsule (60 mg total) by mouth every 12 (twelve) hours. 60 capsule 0  . FLUoxetine (PROZAC) 40 MG capsule Take  40 mg by mouth daily.     Marland Kitchen HYDROcodone-acetaminophen (NORCO/VICODIN) 5-325 MG tablet Take 1-2 tablets by mouth every 4 (four) hours as needed for moderate pain. 15 tablet 0  . Insulin Degludec (TRESIBA FLEXTOUCH) 100 UNIT/ML SOPN Inject 46 Units into the skin every morning.     Marland Kitchen JARDIANCE 10 MG TABS tablet TK 1 T PO QD  6  . losartan-hydrochlorothiazide (HYZAAR) 50-12.5 MG tablet Take 1 tablet by mouth daily.    . metFORMIN (GLUCOPHAGE) 500 MG tablet Take 1,000 mg by mouth 2 (two) times daily with a meal.     . metoprolol tartrate (LOPRESSOR) 25 MG tablet Take 1 tablet (25 mg total) by mouth 2 (two) times daily. 60 tablet 0  . omeprazole (PRILOSEC) 20 MG capsule Take 20 mg by mouth 2 (two) times daily before a meal.     . senna-docusate (SENOKOT-S) 8.6-50 MG tablet Take 1 tablet by mouth at bedtime as needed for mild constipation. 30 tablet 0  . TRESIBA FLEXTOUCH 200 UNIT/ML SOPN Inject 1 application into the skin daily. Inject 46 units daily subcutaneous    . TRULICITY 1.5 0000000 SOPN INJ 1.5 MG [place] ONCE WEEKLY  6  . Vitamin D, Ergocalciferol, (DRISDOL) 50000 units CAPS capsule Take 50,000 Units by mouth every 7 (seven) days.     No current facility-administered medications for this visit.    Allergies:   Lisinopril  Social History: Social History   Social History  . Marital Status: Married    Spouse Name: N/A  . Number of Children: 2  . Years of Education: College   Occupational History  . Not on file.   Social History Main Topics  . Smoking status: Former Smoker    Quit date: 08/26/2006  . Smokeless tobacco: Not on file  . Alcohol Use: No  . Drug Use: No  . Sexual Activity: Not on file   Other Topics Concern  . Not on file   Social History Narrative   Drinks 2 cups of coffee a day     Family History: Family History  Problem Relation Age of Onset  . Diabetes Mother   . Heart attack Mother 74  . Diabetes Father   . Sleep apnea Father   . Heart attack Father 27   . Heart attack Cousin     Review of Systems: All other systems reviewed and are otherwise negative except as noted above.   Physical Exam: VS:  BP 122/80 mmHg  Pulse 94  Ht 5\' 6"  (1.676 m)  Wt 218 lb 3.2 oz (98.975 kg)  BMI 35.24 kg/m2  SpO2 96% , BMI Body mass index is 35.24 kg/(m^2). Wt Readings from Last 3 Encounters:  11/28/15 218 lb 3.2 oz (98.975 kg)  11/13/15 220 lb (99.791 kg)  11/02/15 220 lb 10.9 oz (100.1 kg)    GEN- The patient is obese appearing, alert and oriented x 3 today.   HEENT: normocephalic, atraumatic; sclera clear, conjunctiva pink; hearing intact; oropharynx clear; neck supple  Lungs- Clear to ausculation bilaterally, normal work of breathing.  No wheezes, rales, rhonchi Heart- Regular rate and rhythm, no murmurs, rubs or gallops  GI- soft, non-tender, non-distended, bowel sounds present  Extremities- no clubbing, cyanosis, or edema; DP/PT/radial pulses 2+ bilaterally MS- no significant deformity or atrophy Skin- warm and dry, no rash or lesion  Psych- euthymic mood, full affect Neuro- strength and sensation are intact   EKG:  EKG is ordered today. The ekg ordered today shows sinus rhythm, rate 94  Recent Labs: 10/31/2015: B Natriuretic Peptide 548.1*; TSH 2.099 11/01/2015: Magnesium 1.8 11/02/2015: ALT 19; BUN 15; Creatinine, Ser 0.91; Hemoglobin 10.6*; Platelets 185; Potassium 4.2; Sodium 139    Other studies Reviewed: Additional studies/ records that were reviewed today include: hospital records, echo, myoview   Assessment and Plan: 1.  Ventricular tachycardia In the setting of shingles and respiratory infection No clinical recurrence on CCB EF normal per echo; slightly depressed on myoview Will consider more invasive testing if recurrence or ischemic symptoms Will give long acting CCB and BB today.  If no recurrence in 3 months, consider discontinuing BB   2.  Abnormal myoview No ischemic symptoms Dr Lovena Le reviewed and recommended  reserving more invasive testing for ischemic symptoms  3.  HTN Stable No change required today  4.  Obesity Weight loss recommended   5.  OSA/fatigue Recently diagnosed Pending CPAP titration If no improvement in fatigue with treatment of OSA, consider further cardiac testing in setting of abnormal myoview and strong family history of CAD    Current medicines are reviewed at length with the patient today.   The patient does not have concerns regarding her medicines.  The following changes were made today:  none  Labs/ tests ordered today include: none  No orders of the defined types were placed in this encounter.     Disposition:   Follow up with Dr Lovena Le  in 3 months   Signed, Chanetta Marshall, NP 11/28/2015 9:49 AM   [facility] [address] [place] [address] [phone] (office) [phone] (fax)

## 2016-02-25 ENCOUNTER — Ambulatory Visit: Payer: 59 | Admitting: Internal Medicine

## 2016-03-02 ENCOUNTER — Ambulatory Visit (INDEPENDENT_AMBULATORY_CARE_PROVIDER_SITE_OTHER): Payer: 59 | Admitting: Neurology

## 2016-03-02 ENCOUNTER — Encounter: Payer: Self-pay | Admitting: Neurology

## 2016-03-02 VITALS — BP 132/78 | HR 80 | Resp 16 | Ht 66.0 in | Wt 222.0 lb

## 2016-03-02 DIAGNOSIS — R000 Tachycardia, unspecified: Secondary | ICD-10-CM

## 2016-03-02 DIAGNOSIS — G4733 Obstructive sleep apnea (adult) (pediatric): Secondary | ICD-10-CM

## 2016-03-02 NOTE — Patient Instructions (Addendum)
We will arrange for you to come back to the sleep lab during the daytime for a so called PAP nap appointment with Robin.   You can try Melatonin at night for sleep: take 1 mg to 3 mg, one to 2 hours before your bedtime. You can go up to 5 mg if needed. It is over the counter and comes in pill form, chewable form and spray, if you prefer.    I do commend you for trying autoPAP. We will work with you on this, hang in there.

## 2016-03-02 NOTE — Progress Notes (Signed)
Subjective:    Patient ID: Catherine Hebert is a 45 y.o. female.  HPI     Interim history:   Catherine Hebert is a 45 year old right-handed woman with an underlying medical history of type 2 diabetes, diverticulosis, reflux disease, hiatal hernia, status post esophageal dilatation, hypertension, kidney stone, and obesity, who presents for follow-up consultation of her obstructive sleep apnea. The patient is unaccompanied today. I first met her on 10/06/2015 at the request of her primary care physician, at which time she reported snoring, excessive daytime somnolence and morning headaches. I invited her back for a sleep study. Her insurance denied an attended sleep study and she had a home sleep test on 11/11/2015 which showed a total RDI of 14 per hour and oxyhemoglobin desaturation nadir of 80%. I requested an attended CPAP titration study but insurance denied it. I placed her on AutoPap therapy.  Today, 03/02/2016: I reviewed her AutoPap compliance data from 12/17/2015 through 02/29/2016 which is a total of 75 days, during which time she used her machine only 14 days with percent used days greater than 4 hours at 1%, indicating noncompliance with an average usage of 19 minutes. AHI 7.2 per hour, 95th percentile pressure at 8.4, leak consistently high at 42.0 L/m for the 95th percentile. Pressure range of 4 cm to 13 cm.  Today, 03/02/2016: She reports having difficulty with AutoPap. She has tried different masks, is a mouth breather and has been on a FFM. She has difficulty falling asleep with it. She has tried, but cannot use it thus far. Of note, she was hospitalized in the interim for tachycardia and elevated troponin. She was admitted on 10/31/2015 and discharged on 11/02/2015. She says, she had a panic attack. She was stressed at the time, Echo and nuclear stress test consistent with prior ischemia. Has FU with Dr. Lovena Le on 03/18/16.   Previously:  10/06/2015: She reports snoring and excessive  daytime somnolence as well as morning headaches. I reviewed your office note from 09/29/2015, which you kindly included. She had a recent sinus infection and lingering cough.  She was told to increase her omeprazole, as she was complaining of nighttime cough. She has been on omeprazole for years. She has had snoring, witnessed apneas and excessive daytime somnolence for years. Her husband has noted pauses in her breathing. Her Epworth sleepiness score is 18 out of 24 today, her fatigue score is 45 out of 63. Bedtime can be as early as 8:30 PM and she falls asleep quickly but she does not stay asleep well. She has nocturia on average 3 times per night. Wake up time is around 7 AM but she does not wake up rested. She has occasional morning headaches. Her father has obstructive sleep apnea and has been using a CPAP machine for years. Over the past 3 years she has been able to lose about 25 pounds. She has gained about 10 pounds from her lowest weight. She is trying to lose weight. 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 She quit smoking many years ago, 10+ years ago was a social smoker, never a chain smoker. She drinks about 2 cups of coffee per day, typically no sodas or tea. She has had problems with blood sugar control. She has been diabetic for about 20 years and has a family history of diabetes on both sides. Her A1c has been as high as 12 she reports. She is supposed to have blood work again soon. She had recent blood work  in your office and an EKG. She has a resting heart rate which is on the higher side in the 90s and sometimes low 100s. She denies restless leg symptoms or leg twitching at night but has had symptoms of diabetic neuropathy, no recent painful sensation. She lives with her husband of 12 years, he is 21 years older and has some medical problems. She has 2 teenage children, ages 19 and 46.  Her Past Medical History Is Significant For: Past Medical History:  Diagnosis Date  .  Bursitis of elbow   . Diabetes mellitus without complication ([place])   . Diverticulosis   . Esophageal reflux disease   . Esophageal stricture   . Fatigue   . GERD (gastroesophageal reflux disease)   . Hiatal hernia   . Hypertension   . Kidney stones   . Ovarian cyst     Her Past Surgical History Is Significant For: Past Surgical History:  Procedure Laterality Date  . CESAREAN SECTION  1998  . CESAREAN SECTION  2003  . i&d back abscess  08/25/12  . kidney stone removal  1993    Her Family History Is Significant For: Family History  Problem Relation Age of Onset  . Diabetes Mother   . Heart attack Mother 13  . Diabetes Father   . Sleep apnea Father   . Heart attack Father 1  . Heart attack Cousin     Her Social History Is Significant For: Social History   Social History  . Marital status: Married    Spouse name: N/A  . Number of children: 2  . Years of education: College   Social History Main Topics  . Smoking status: Former Smoker    Quit date: 08/26/2006  . Smokeless tobacco: None  . Alcohol use No  . Drug use: No  . Sexual activity: Not Asked   Other Topics Concern  . None   Social History Narrative   Drinks 2 cups of coffee a day     Her Allergies Are:  Allergies  Allergen Reactions  . Lisinopril     cough  :  Her Current Medications Are:  Outpatient Encounter Prescriptions as of 03/02/2016  Medication Sig  . aspirin 81 MG tablet Take 81 mg by mouth daily.  Marland Kitchen atorvastatin (LIPITOR) 20 MG tablet Take 1 tablet (20 mg total) by mouth daily at 6 PM.  . diltiazem (CARDIZEM CD) 120 MG 24 hr capsule Take 1 capsule (120 mg total) by mouth daily.  Marland Kitchen FLUoxetine (PROZAC) 40 MG capsule Take 40 mg by mouth daily.   . Insulin Degludec (TRESIBA FLEXTOUCH) 100 UNIT/ML SOPN Inject 46 Units into the skin every morning.   Marland Kitchen JARDIANCE 10 MG TABS tablet TK 1 T PO QD  . losartan-hydrochlorothiazide (HYZAAR) 50-12.5 MG tablet Take 1 tablet by mouth daily.  . metFORMIN  (GLUCOPHAGE) 500 MG tablet Take 1,000 mg by mouth 2 (two) times daily with a meal.   . metoprolol succinate (TOPROL XL) 25 MG 24 hr tablet Take 1 tablet (25 mg total) by mouth daily.  Marland Kitchen omeprazole (PRILOSEC) 20 MG capsule Take 20 mg by mouth 2 (two) times daily before a meal.   . TRESIBA FLEXTOUCH 200 UNIT/ML SOPN Inject 1 application into the skin daily. Inject 46 units daily subcutaneous  . TRULICITY 1.5 LY/6.5KP SOPN INJ 1.5 MG [place] ONCE WEEKLY  . [DISCONTINUED] Cholecalciferol (VITAMIN D3) 50000 units CAPS Take 1 tablet by mouth once a week.  . [DISCONTINUED] HYDROcodone-acetaminophen (NORCO/VICODIN) 5-325 MG  tablet Take 1-2 tablets by mouth every 4 (four) hours as needed for moderate pain.  . [DISCONTINUED] senna-docusate (SENOKOT-S) 8.6-50 MG tablet Take 1 tablet by mouth at bedtime as needed for mild constipation.  . [DISCONTINUED] Vitamin D, Ergocalciferol, (DRISDOL) 50000 units CAPS capsule Take 50,000 Units by mouth every 7 (seven) days.   No facility-administered encounter medications on file as of 03/02/2016.   :  Review of Systems:  Out of a complete 14 point review of systems, all are reviewed and negative with the exception of these symptoms as listed below: Review of Systems  Neurological:       Patient reports that she has not done well with CPAP. States that she cannot sleep with it. It makes her feel claustrophobic. She tried to use it one night this past weekend. Kept it on for only 3 hours and did not sleep at all.  Catherine Hebert has tried a couple of different masks without success.     Objective:  Neurologic Exam  Physical Exam Physical Examination:   Vitals:   03/02/16 1516  BP: 132/78  Pulse: 80  Resp: 16   General Examination: The patient is a very pleasant 44 y.o. female in no acute distress. She appears well-developed and well-nourished and very well groomed.   HEENT: Normocephalic, atraumatic, pupils are equal, round and reactive to light and accommodation. Full  wig in place. Extraocular tracking is good without limitation to gaze excursion or nystagmus noted. Normal smooth pursuit is noted. Hearing is grossly intact. Face is symmetric with normal facial animation and normal facial sensation. Speech is clear with no dysarthria noted. There is no hypophonia. There is no lip, neck/head, jaw or voice tremor. Neck is supple with full range of passive and active motion. There are no carotid bruits on auscultation. Oropharynx exam reveals: mild to moderate mouth dryness, good dental hygiene and moderate airway crowding, due to smaller airway entry, thicker soft palate, thicker tongue. Mallampati is class III. Tongue protrudes centrally and palate elevates symmetrically. Tonsils are absent.   Chest: Clear to auscultation without wheezing, rhonchi or crackles noted.  Heart: S1+S2+0, regular and normal without murmurs, rubs or gallops noted.   Abdomen: Soft, non-tender and non-distended with normal bowel sounds appreciated on auscultation.  Extremities: There is no pitting edema in the distal lower extremities bilaterally. Pedal pulses are intact.  Skin: Warm and dry without trophic changes noted. There are no varicose veins.  Musculoskeletal: exam reveals no obvious joint deformities, tenderness or joint swelling or erythema.   Neurologically:  Mental status: The patient is awake, alert and oriented in all 4 spheres. Her immediate and remote memory, attention, language skills and fund of knowledge are appropriate. There is no evidence of aphasia, agnosia, apraxia or anomia. Speech is clear with normal prosody and enunciation. Thought process is linear. Mood is normal and affect is normal.  Cranial nerves II - XII are as described above under HEENT exam. In addition: shoulder shrug is normal with equal shoulder height noted. Motor exam: Normal bulk, strength and tone is noted. There is no drift, tremor or rebound. Romberg is negative. Reflexes are 1-2+ throughout.  Fine motor skills and coordination: intact with normal finger taps, normal hand movements, normal rapid alternating patting, normal foot taps and normal foot agility.  Cerebellar testing: No dysmetria or intention tremor. There is no truncal or gait ataxia.  Sensory exam: intact to light touch in the upper and lower extremities.  Gait, station and balance: She stands easily. No  veering to one side is noted. No leaning to one side is noted. Posture is age-appropriate and stance is narrow based. Gait shows normal stride length and normal pace. No problems turning are noted.  Assessment and Plan:  In summary, FLORELLA MCNEESE is a very pleasant 45 year old female with an underlying medical history of type 2 diabetes, diverticulosis, reflux disease, hiatal hernia, status post esophageal dilatation, hypertension, kidney stone, and obesity, who presents for follow-up consultation after her home sleep test and AutoPap trial. She has evidence of moderate obstructive sleep apnea based on a home sleep test results, AHI was 14 per hour but likely given that this was a home sleep test it could be underestimated. O2 nadir was 80% for the study, time below 88% saturation was 22 minutes for the night. She had a brief hospitalization in the interim for palpitations. She had workup in the form of echocardiogram and nuclear stress test. She has a follow-up with cardiology pending for later this month. I talked to the patient at length today. She is commended for trying AutoPap therapy. Unfortunately, she has not been compliant and has been struggling with this from the get go. She has trouble falling asleep. I suggested she try over-the-counter melatonin. In addition, I suggested she return for a daytime appointment in our sleep lab for a Pap nap during which her sleep lab manager can work with her as far as trying to follow sleep with the CPAP on and perhaps trying get another mask for her. She does appear to be a mouth  breather. In light of her medical history I would like to push her to try positive airway pressure just a little while longer to see if we can help her adapt to treatment. Alternatively, she may be a candidate for an oral appliance. She is encouraged to try to work on weight loss. She is encouraged to stay well hydrated with water.  I will see her back in about 3 months, sooner as needed. Physical exam is stable. I answered all her questions today and she was in agreement.  I spent 25 minutes in total face-to-face time with the patient, more than 50% of which was spent in counseling and coordination of care, reviewing test results, reviewing medication and discussing or reviewing the diagnosis of OSA, its prognosis and treatment options.

## 2016-03-08 ENCOUNTER — Telehealth: Payer: Self-pay

## 2016-03-08 NOTE — Telephone Encounter (Signed)
LM for patient to call and schedule pap nap.

## 2016-03-18 ENCOUNTER — Ambulatory Visit: Payer: 59 | Admitting: Internal Medicine

## 2016-05-03 ENCOUNTER — Other Ambulatory Visit: Payer: Self-pay | Admitting: Nurse Practitioner

## 2016-05-03 MED ORDER — DILTIAZEM HCL ER COATED BEADS 120 MG PO CP24: 120.0000 mg | ORAL_CAPSULE | Freq: Every day | ORAL | 6 refills | Status: DC

## 2016-05-05 ENCOUNTER — Ambulatory Visit (INDEPENDENT_AMBULATORY_CARE_PROVIDER_SITE_OTHER): Payer: 59 | Admitting: Internal Medicine

## 2016-05-05 ENCOUNTER — Encounter: Payer: Self-pay | Admitting: Internal Medicine

## 2016-05-05 VITALS — BP 153/89 | HR 95 | Ht 66.0 in | Wt 220.0 lb

## 2016-05-05 DIAGNOSIS — I472 Ventricular tachycardia, unspecified: Secondary | ICD-10-CM

## 2016-05-05 DIAGNOSIS — I471 Supraventricular tachycardia: Secondary | ICD-10-CM

## 2016-05-05 NOTE — Patient Instructions (Signed)

## 2016-05-05 NOTE — Progress Notes (Signed)
HPI Catherine Hebert returns today for followup. She is a pleasant 45 yo woman with obesity and HTN who presented several months ago with VT originating from just below the AV node. She was treated with calcium channel blocker and has done well. She denies chest pain. She has been unable to lose weight.  Allergies  Allergen Reactions  . Lisinopril     cough     Current Outpatient Prescriptions  Medication Sig Dispense Refill  . aspirin 81 MG tablet Take 81 mg by mouth daily.    Marland Kitchen atorvastatin (LIPITOR) 20 MG tablet Take 1 tablet (20 mg total) by mouth daily at 6 PM. 30 tablet 0  . diltiazem (CARDIZEM CD) 120 MG 24 hr capsule Take 1 capsule (120 mg total) by mouth daily. 30 capsule 6  . fluconazole (DIFLUCAN) 150 MG tablet Take 1 tablet by mouth daily as needed.    Marland Kitchen FLUoxetine (PROZAC) 40 MG capsule Take 40 mg by mouth daily.     . Insulin Degludec (TRESIBA FLEXTOUCH) 100 UNIT/ML SOPN Inject 46 Units into the skin every morning.     Marland Kitchen JARDIANCE 10 MG TABS tablet TK 1 T PO QD  6  . losartan-hydrochlorothiazide (HYZAAR) 50-12.5 MG tablet Take 1 tablet by mouth daily.    . metFORMIN (GLUCOPHAGE) 500 MG tablet Take 1,000 mg by mouth 2 (two) times daily with a meal.     . metoprolol succinate (TOPROL XL) 25 MG 24 hr tablet Take 1 tablet (25 mg total) by mouth daily. 60 tablet 6  . omeprazole (PRILOSEC) 20 MG capsule Take 20 mg by mouth 2 (two) times daily before a meal.     . TRESIBA FLEXTOUCH 200 UNIT/ML SOPN Inject 1 application into the skin daily. Inject 46 units daily subcutaneous    . TRULICITY 1.5 0000000 SOPN INJ 1.5 MG [place] ONCE WEEKLY  6  . ondansetron (ZOFRAN-ODT) 4 MG disintegrating tablet Take 1 tablet by mouth every 6 (six) hours as needed.     No current facility-administered medications for this visit.      Past Medical History:  Diagnosis Date  . Bursitis of elbow   . Diabetes mellitus without complication ([place])   . Diverticulosis   . Esophageal reflux disease     . Esophageal stricture   . Fatigue   . GERD (gastroesophageal reflux disease)   . Hiatal hernia   . Hypertension   . Kidney stones   . Ovarian cyst     ROS:   All systems reviewed and negative except as noted in the HPI.   Past Surgical History:  Procedure Laterality Date  . CESAREAN SECTION  1998  . CESAREAN SECTION  2003  . i&d back abscess  08/25/12  . kidney stone removal  1993     Family History  Problem Relation Age of Onset  . Diabetes Mother   . Heart attack Mother 62  . Diabetes Father   . Sleep apnea Father   . Heart attack Father 74  . Heart attack Cousin      Social History   Social History  . Marital status: Married    Spouse name: N/A  . Number of children: 2  . Years of education: College   Occupational History  . Not on file.   Social History Main Topics  . Smoking status: Former Smoker    Quit date: 08/26/2006  . Smokeless tobacco: Never Used  . Alcohol use No  . Drug use:  No  . Sexual activity: Not on file   Other Topics Concern  . Not on file   Social History Narrative   Drinks 2 cups of coffee a day      BP (!) 153/89   Pulse 95   Ht 5\' 6"  (1.676 m)   Wt 220 lb (99.8 kg)   BMI 35.51 kg/m   Physical Exam:  Well appearing obese woman,  NAD HEENT: Unremarkable Neck:  No JVD, no thyromegally Lymphatics:  No adenopathy Back:  No CVA tenderness Lungs:  Clear HEART:  Regular rate rhythm, no murmurs, no rubs, no clicks Abd:  soft, positive bowel sounds, no organomegally, no rebound, no guarding Ext:  2 plus pulses, no edema, no cyanosis, no clubbing Skin:  No rashes no nodules Neuro:  CN II through XII intact, motor grossly intact  EKG - nsr with poor R wave progression  Assess/Plan: 1. VT - she has had no recurrence. She will continue her calcium channel blocker. 2. Obesity - I have encouraged the patient to lose weight.  3. HTN - we discussed the importance of weight loss and a low sodium diet. She may need to  increase her calcium channel blocker.  Mikle Bosworth.D.

## 2016-06-03 DIAGNOSIS — R05 Cough: Secondary | ICD-10-CM | POA: Diagnosis not present

## 2016-06-03 DIAGNOSIS — J309 Allergic rhinitis, unspecified: Secondary | ICD-10-CM | POA: Diagnosis not present

## 2016-06-03 DIAGNOSIS — I10 Essential (primary) hypertension: Secondary | ICD-10-CM | POA: Diagnosis not present

## 2016-06-10 ENCOUNTER — Ambulatory Visit: Payer: 59 | Admitting: Neurology

## 2016-06-18 DIAGNOSIS — G4733 Obstructive sleep apnea (adult) (pediatric): Secondary | ICD-10-CM | POA: Diagnosis not present

## 2016-06-22 DIAGNOSIS — R05 Cough: Secondary | ICD-10-CM | POA: Diagnosis not present

## 2016-06-22 DIAGNOSIS — I10 Essential (primary) hypertension: Secondary | ICD-10-CM | POA: Diagnosis not present

## 2016-06-22 DIAGNOSIS — E1165 Type 2 diabetes mellitus with hyperglycemia: Secondary | ICD-10-CM | POA: Diagnosis not present

## 2016-06-22 DIAGNOSIS — K219 Gastro-esophageal reflux disease without esophagitis: Secondary | ICD-10-CM | POA: Diagnosis not present

## 2016-07-19 DIAGNOSIS — G4733 Obstructive sleep apnea (adult) (pediatric): Secondary | ICD-10-CM | POA: Diagnosis not present

## 2016-08-16 DIAGNOSIS — G4733 Obstructive sleep apnea (adult) (pediatric): Secondary | ICD-10-CM | POA: Diagnosis not present

## 2016-08-25 ENCOUNTER — Ambulatory Visit: Payer: 59 | Admitting: Neurology

## 2016-08-25 ENCOUNTER — Telehealth: Payer: Self-pay

## 2016-08-25 NOTE — Telephone Encounter (Signed)
Patient did not show to appt.  

## 2016-08-30 ENCOUNTER — Encounter: Payer: Self-pay | Admitting: Neurology

## 2016-09-16 DIAGNOSIS — G4733 Obstructive sleep apnea (adult) (pediatric): Secondary | ICD-10-CM | POA: Diagnosis not present

## 2016-10-12 DIAGNOSIS — E1139 Type 2 diabetes mellitus with other diabetic ophthalmic complication: Secondary | ICD-10-CM | POA: Diagnosis not present

## 2016-10-12 DIAGNOSIS — I10 Essential (primary) hypertension: Secondary | ICD-10-CM | POA: Diagnosis not present

## 2016-10-12 DIAGNOSIS — K219 Gastro-esophageal reflux disease without esophagitis: Secondary | ICD-10-CM | POA: Diagnosis not present

## 2016-10-12 DIAGNOSIS — E1165 Type 2 diabetes mellitus with hyperglycemia: Secondary | ICD-10-CM | POA: Diagnosis not present

## 2016-10-16 DIAGNOSIS — G4733 Obstructive sleep apnea (adult) (pediatric): Secondary | ICD-10-CM | POA: Diagnosis not present

## 2016-11-03 DIAGNOSIS — H10429 Simple chronic conjunctivitis, unspecified eye: Secondary | ICD-10-CM | POA: Diagnosis not present

## 2016-11-11 ENCOUNTER — Other Ambulatory Visit: Payer: Self-pay | Admitting: Nurse Practitioner

## 2017-02-07 DIAGNOSIS — H60501 Unspecified acute noninfective otitis externa, right ear: Secondary | ICD-10-CM | POA: Diagnosis not present

## 2017-02-07 DIAGNOSIS — R05 Cough: Secondary | ICD-10-CM | POA: Diagnosis not present

## 2017-02-09 ENCOUNTER — Other Ambulatory Visit: Payer: Self-pay | Admitting: Nurse Practitioner

## 2017-05-10 ENCOUNTER — Other Ambulatory Visit: Payer: Self-pay | Admitting: Internal Medicine

## 2017-08-11 ENCOUNTER — Other Ambulatory Visit: Payer: Self-pay | Admitting: Internal Medicine

## 2017-08-20 ENCOUNTER — Other Ambulatory Visit: Payer: Self-pay | Admitting: Internal Medicine

## 2017-09-03 ENCOUNTER — Other Ambulatory Visit: Payer: Self-pay | Admitting: Internal Medicine

## 2017-09-12 DIAGNOSIS — R82998 Other abnormal findings in urine: Secondary | ICD-10-CM | POA: Diagnosis not present

## 2017-09-12 DIAGNOSIS — R05 Cough: Secondary | ICD-10-CM | POA: Diagnosis not present

## 2017-09-12 DIAGNOSIS — G4730 Sleep apnea, unspecified: Secondary | ICD-10-CM | POA: Diagnosis not present

## 2017-09-12 DIAGNOSIS — Z1389 Encounter for screening for other disorder: Secondary | ICD-10-CM | POA: Diagnosis not present

## 2017-09-12 DIAGNOSIS — R5383 Other fatigue: Secondary | ICD-10-CM | POA: Diagnosis not present

## 2017-09-12 DIAGNOSIS — R109 Unspecified abdominal pain: Secondary | ICD-10-CM | POA: Diagnosis not present

## 2017-09-12 DIAGNOSIS — J309 Allergic rhinitis, unspecified: Secondary | ICD-10-CM | POA: Diagnosis not present

## 2017-09-12 DIAGNOSIS — E1139 Type 2 diabetes mellitus with other diabetic ophthalmic complication: Secondary | ICD-10-CM | POA: Diagnosis not present

## 2017-09-13 DIAGNOSIS — R109 Unspecified abdominal pain: Secondary | ICD-10-CM | POA: Diagnosis not present

## 2017-09-13 DIAGNOSIS — R112 Nausea with vomiting, unspecified: Secondary | ICD-10-CM | POA: Diagnosis not present

## 2017-09-14 ENCOUNTER — Other Ambulatory Visit: Payer: Self-pay | Admitting: Internal Medicine

## 2017-09-14 DIAGNOSIS — R112 Nausea with vomiting, unspecified: Secondary | ICD-10-CM

## 2017-09-14 DIAGNOSIS — R1084 Generalized abdominal pain: Secondary | ICD-10-CM

## 2017-09-15 ENCOUNTER — Ambulatory Visit
Admission: RE | Admit: 2017-09-15 | Discharge: 2017-09-15 | Disposition: A | Discharge status: Home / Self Care | Payer: 59 | Source: Ambulatory Visit | Attending: Internal Medicine | Admitting: Internal Medicine

## 2017-09-15 DIAGNOSIS — R112 Nausea with vomiting, unspecified: Secondary | ICD-10-CM

## 2017-09-15 DIAGNOSIS — R1084 Generalized abdominal pain: Secondary | ICD-10-CM

## 2017-09-15 DIAGNOSIS — K8020 Calculus of gallbladder without cholecystitis without obstruction: Secondary | ICD-10-CM | POA: Diagnosis not present

## 2017-11-14 IMAGING — NM NM MISC PROCEDURE
6 series · 36 of 36 positions shown · non-contrast
Comparison: none

[Series 1: wbr_r-proj_st rest · 6.51mm/px · 6 of 64 frames shown]
[frame 6/64]
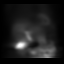
[frame 16/64]
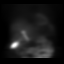
[frame 27/64]
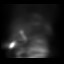
[frame 38/64]
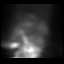
[frame 48/64]
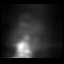
[frame 59/64]
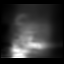

[Series 1: rest · 6.51mm/px · 6 of 64 frames shown]
[frame 6/64]
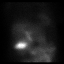
[frame 16/64]
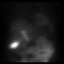
[frame 27/64]
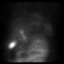
[frame 38/64]
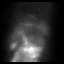
[frame 48/64]
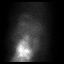
[frame 59/64]
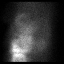

[Series 2: stress · 6.51mm/px · 6 of 512 frames shown (1 of 2)]
[frame 43/512]
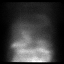
[frame 128/512]
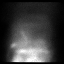
[frame 214/512]
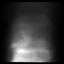
[frame 299/512]
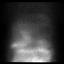
[frame 384/512]
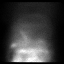
[frame 470/512]
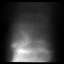

[Series 2: stress · 6.51mm/px · 6 of 64 frames shown (2 of 2)]
[frame 6/64]
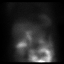
[frame 16/64]
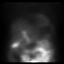
[frame 27/64]
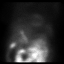
[frame 38/64]
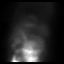
[frame 48/64]
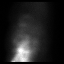
[frame 59/64]
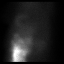

[Series 2: wbr_s-proj_st stress · 6.51mm/px · 6 of 512 frames shown (1 of 2)]
[frame 43/512]
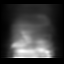
[frame 128/512]
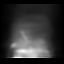
[frame 214/512]
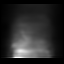
[frame 299/512]
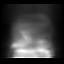
[frame 384/512]
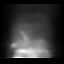
[frame 470/512]
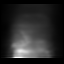

[Series 2: wbr_s-proj_st stress · 6.51mm/px · 6 of 64 frames shown (2 of 2)]
[frame 6/64]
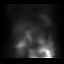
[frame 16/64]
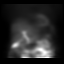
[frame 27/64]
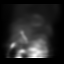
[frame 38/64]
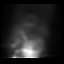
[frame 48/64]
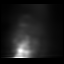
[frame 59/64]
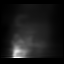

[36 of 36 positions shown; findings below may reference images not displayed]

Canned report from images found in remote index.

Refer to host system for actual result text.

## 2018-01-18 DIAGNOSIS — E1165 Type 2 diabetes mellitus with hyperglycemia: Secondary | ICD-10-CM | POA: Diagnosis not present

## 2018-01-18 DIAGNOSIS — I10 Essential (primary) hypertension: Secondary | ICD-10-CM | POA: Diagnosis not present

## 2018-01-18 DIAGNOSIS — E1139 Type 2 diabetes mellitus with other diabetic ophthalmic complication: Secondary | ICD-10-CM | POA: Diagnosis not present

## 2018-01-26 DIAGNOSIS — Z794 Long term (current) use of insulin: Secondary | ICD-10-CM | POA: Diagnosis not present

## 2018-01-26 DIAGNOSIS — I10 Essential (primary) hypertension: Secondary | ICD-10-CM | POA: Diagnosis not present

## 2018-01-26 DIAGNOSIS — E1165 Type 2 diabetes mellitus with hyperglycemia: Secondary | ICD-10-CM | POA: Diagnosis not present

## 2018-02-12 ENCOUNTER — Other Ambulatory Visit: Payer: Self-pay | Admitting: Nurse Practitioner

## 2018-03-06 DIAGNOSIS — E1165 Type 2 diabetes mellitus with hyperglycemia: Secondary | ICD-10-CM | POA: Diagnosis not present

## 2018-03-13 DIAGNOSIS — Z23 Encounter for immunization: Secondary | ICD-10-CM | POA: Diagnosis not present

## 2018-03-13 DIAGNOSIS — E1165 Type 2 diabetes mellitus with hyperglycemia: Secondary | ICD-10-CM | POA: Diagnosis not present

## 2018-03-13 DIAGNOSIS — Z4681 Encounter for fitting and adjustment of insulin pump: Secondary | ICD-10-CM | POA: Diagnosis not present

## 2018-03-13 DIAGNOSIS — I10 Essential (primary) hypertension: Secondary | ICD-10-CM | POA: Diagnosis not present

## 2018-04-13 DIAGNOSIS — E1165 Type 2 diabetes mellitus with hyperglycemia: Secondary | ICD-10-CM | POA: Diagnosis not present

## 2018-04-13 DIAGNOSIS — I10 Essential (primary) hypertension: Secondary | ICD-10-CM | POA: Diagnosis not present

## 2018-04-13 DIAGNOSIS — Z4681 Encounter for fitting and adjustment of insulin pump: Secondary | ICD-10-CM | POA: Diagnosis not present

## 2018-05-11 DIAGNOSIS — E1165 Type 2 diabetes mellitus with hyperglycemia: Secondary | ICD-10-CM | POA: Diagnosis not present

## 2018-05-11 DIAGNOSIS — I10 Essential (primary) hypertension: Secondary | ICD-10-CM | POA: Diagnosis not present

## 2018-05-11 DIAGNOSIS — G5601 Carpal tunnel syndrome, right upper limb: Secondary | ICD-10-CM | POA: Diagnosis not present

## 2018-05-29 DIAGNOSIS — I10 Essential (primary) hypertension: Secondary | ICD-10-CM | POA: Diagnosis not present

## 2018-05-29 DIAGNOSIS — R05 Cough: Secondary | ICD-10-CM | POA: Diagnosis not present

## 2018-05-29 DIAGNOSIS — J069 Acute upper respiratory infection, unspecified: Secondary | ICD-10-CM | POA: Diagnosis not present

## 2018-06-07 DIAGNOSIS — E1165 Type 2 diabetes mellitus with hyperglycemia: Secondary | ICD-10-CM | POA: Diagnosis not present

## 2018-06-08 DIAGNOSIS — E1139 Type 2 diabetes mellitus with other diabetic ophthalmic complication: Secondary | ICD-10-CM | POA: Diagnosis not present

## 2018-06-08 DIAGNOSIS — E11319 Type 2 diabetes mellitus with unspecified diabetic retinopathy without macular edema: Secondary | ICD-10-CM | POA: Diagnosis not present

## 2018-06-08 DIAGNOSIS — E1165 Type 2 diabetes mellitus with hyperglycemia: Secondary | ICD-10-CM | POA: Diagnosis not present

## 2018-08-17 DIAGNOSIS — E1139 Type 2 diabetes mellitus with other diabetic ophthalmic complication: Secondary | ICD-10-CM | POA: Diagnosis not present

## 2018-08-17 DIAGNOSIS — I10 Essential (primary) hypertension: Secondary | ICD-10-CM | POA: Diagnosis not present

## 2018-08-17 DIAGNOSIS — Z794 Long term (current) use of insulin: Secondary | ICD-10-CM | POA: Diagnosis not present

## 2018-08-31 DIAGNOSIS — E1165 Type 2 diabetes mellitus with hyperglycemia: Secondary | ICD-10-CM | POA: Diagnosis not present

## 2018-09-22 DIAGNOSIS — R0609 Other forms of dyspnea: Secondary | ICD-10-CM | POA: Diagnosis not present

## 2018-09-22 DIAGNOSIS — K2970 Gastritis, unspecified, without bleeding: Secondary | ICD-10-CM | POA: Diagnosis not present

## 2018-10-02 ENCOUNTER — Other Ambulatory Visit: Payer: Self-pay

## 2018-10-02 ENCOUNTER — Ambulatory Visit (INDEPENDENT_AMBULATORY_CARE_PROVIDER_SITE_OTHER): Payer: 59 | Admitting: Internal Medicine

## 2018-10-02 ENCOUNTER — Encounter: Payer: Self-pay | Admitting: Internal Medicine

## 2018-10-02 VITALS — BP 179/98 | Ht 66.0 in | Wt 230.0 lb

## 2018-10-02 DIAGNOSIS — IMO0001 Reserved for inherently not codable concepts without codable children: Secondary | ICD-10-CM

## 2018-10-02 DIAGNOSIS — E119 Type 2 diabetes mellitus without complications: Secondary | ICD-10-CM

## 2018-10-02 DIAGNOSIS — Z794 Long term (current) use of insulin: Secondary | ICD-10-CM

## 2018-10-02 DIAGNOSIS — R112 Nausea with vomiting, unspecified: Secondary | ICD-10-CM | POA: Diagnosis not present

## 2018-10-02 DIAGNOSIS — K219 Gastro-esophageal reflux disease without esophagitis: Secondary | ICD-10-CM | POA: Diagnosis not present

## 2018-10-02 NOTE — Progress Notes (Signed)
HISTORY OF PRESENT ILLNESS:  Catherine Hebert is a 48 y.o. female with longstanding diabetes mellitus, morbid obesity, and GERD complicated by peptic stricture requiring esophageal dilation who is referred today for a WebEx telemedicine visit in the midst of the coronavirus pandemic at the request of her primary care provider Dr. Reynaldo Minium regarding worsening refractory GERD.  Patient was actually seen in this office remotely and underwent upper endoscopy January 22, 2003 to evaluate dysphasia.  She was found to have reflux esophagitis as well as a peptic stricture which was dilated to a maximal diameter of 17 mm.  She was placed on PPI.  Repeat endoscopy was recommended but not executed.  Patient tells me that she has been on omeprazole for years.  Over the past 4 years she has had chronic dry cough with negative extensive work-up for which it is been suggested may be secondary to GERD.  Over the past year she has had marked exacerbation of classic reflux symptoms as manifested by regurgitation, pyrosis, and intermittent nausea with vomiting.  Significant nocturnal symptoms.  She has missed work on occasions due to the symptom complex.  She has used Zofran for her problems with nausea.  She does report just mild dysphasia.  Over the years she has had poor control of her diabetes (25 years of diabetes) but has done better since being on an insulin pump.  She has been chronically obese.  She was able to lose from 260 down to 200 at one point but has gained 25 pounds back recently.  She does describe early satiety.  Abdominal ultrasound April 2019 revealed gallstones and fatty liver.  Review of outside laboratories from December 2019 finds normal CBC with hemoglobin 14.4.  She denies abdominal pain, odynophagia, hematemesis, or melena  REVIEW OF SYSTEMS:  All non-GI ROS negative unless otherwise stated in the HPI except for fatigue  Past Medical History:  Diagnosis Date  . Bursitis of elbow   . Diabetes  mellitus without complication ([place])   . Diverticulosis   . Esophageal reflux disease   . Esophageal stricture   . Fatigue   . GERD (gastroesophageal reflux disease)   . Hiatal hernia   . Hypertension   . Kidney stones   . Ovarian cyst     Past Surgical History:  Procedure Laterality Date  . CESAREAN SECTION  1998  . CESAREAN SECTION  2003  . i&d back abscess  08/25/12  . kidney stone removal  1993    Social History [name]  reports that she quit smoking about 12 years ago. She has never used smokeless tobacco. She reports that she does not drink alcohol or use drugs.  family history includes Diabetes in her father and mother; Heart attack in her cousin; Heart attack (age of onset: 29) in her mother; Heart attack (age of onset: 27) in her father; Sleep apnea in her father.  Allergies  Allergen Reactions  . Lisinopril     cough       PHYSICAL EXAMINATION: The patient was alert and oriented and in no acute distress. Overweight.  Cooperative.  No other features of physical exam available due to telehealth visit   ASSESSMENT:  1.  GERD.  Significantly exacerbated over the past year.  Concerns include progressive obesity and possible diabetic gastroparesis 2.  Longstanding diabetes mellitus.  Insulin requiring.  Rule out gastroparesis 3.  Nausea and vomiting.  GERD.  Rule out gastroparesis 4.  Morbid obesity   PLAN:  1.  Reflux precautions.  Reviewed the importance of small meals, not eating too close to bedtime, and elevation of the head of bed.  PLEASE SEND PATIENT GERD BROCHURE AND REFLUX PRECAUTIONS SHEET 2.  Instructed to take pantoprazole 40 mg twice daily.  First dose 30 to 60 minutes before breakfast second dose 30 to 60 minutes before evening meal 3.  SCHEDULE EGD IN the [place] WITH ME NEXT AVAILABLE SLOT.  She is HIGH RISK given her comorbidities, body habitus, and insulin requiring diabetes mellitus with the necessary procedure adjustment and drug.The  nature of the procedure, as well as the risks, benefits, and alternatives were carefully and thoroughly reviewed with the patient. Ample time for discussion and questions allowed. The patient understood, was satisfied, and agreed to proceed. 4.  The patient has been instructed to adjust her insulin preprocedure to avoid unwanted hypoglycemia 5.  Consider gastric emptying scan after the results of the upper endoscopy are known This WebEx telemedicine visit was both audio and visual with the patient in her home and myself in my office.  The patient initiated and consented to this visit and understands that there may be an associated professional charge for the service  A copy of this consultation note has been sent to Dr. Reynaldo Minium

## 2018-10-03 ENCOUNTER — Telehealth: Payer: Self-pay | Admitting: Internal Medicine

## 2018-10-03 NOTE — Telephone Encounter (Signed)
New message   Pt scheduled for doxemity video visit with Dr. Lovena Le. Pt smart phone number is listed in appt notes.      Virtual Visit Pre-Appointment Phone Call  "(Name), I am calling you today to discuss your upcoming appointment. We are currently trying to limit exposure to the virus that causes COVID-19 by seeing patients at home rather than in the office."  1. "What is the BEST phone number to call the day of the visit?" - include this in appointment notes  2. Do you have or have access to (through a family member/friend) a smartphone with video capability that we can use for your visit?" a. If yes - list this number in appt notes as cell (if different from BEST phone #) and list the appointment type as a VIDEO visit in appointment notes b. If no - list the appointment type as a PHONE visit in appointment notes  3. Confirm consent - "In the setting of the current Covid19 crisis, you are scheduled for a (phone or video) visit with your provider on (date) at (time).  Just as we do with many in-office visits, in order for you to participate in this visit, we must obtain consent.  If you'd like, I can send this to your mychart (if signed up) or email for you to review.  Otherwise, I can obtain your verbal consent now.  All virtual visits are billed to your insurance company just like a normal visit would be.  By agreeing to a virtual visit, we'd like you to understand that the technology does not allow for your provider to perform an examination, and thus may limit your provider's ability to fully assess your condition. If your provider identifies any concerns that need to be evaluated in person, we will make arrangements to do so.  Finally, though the technology is pretty good, we cannot assure that it will always work on either your or our end, and in the setting of a video visit, we may have to convert it to a phone-only visit.  In either situation, we cannot ensure that we have a secure  connection.  Are you willing to proceed?" STAFF: Did the patient verbally acknowledge consent to telehealth visit? Document YES/NO here: YES  4. Advise patient to be prepared - "Two hours prior to your appointment, go ahead and check your blood pressure, pulse, oxygen saturation, and your weight (if you have the equipment to check those) and write them all down. When your visit starts, your provider will ask you for this information. If you have an Apple Watch or Kardia device, please plan to have heart rate information ready on the day of your appointment. Please have a pen and paper handy nearby the day of the visit as well."  5. Give patient instructions for MyChart download to smartphone OR Doximity/Doxy.me as below if video visit (depending on what platform provider is using)  6. Inform patient they will receive a phone call 15 minutes prior to their appointment time (may be from unknown caller ID) so they should be prepared to answer    TELEPHONE CALL NOTE  Catherine Hebert has been deemed a candidate for a follow-up tele-health visit to limit community exposure during the Covid-19 pandemic. I spoke with the patient via phone to ensure availability of phone/video source, confirm preferred email & phone number, and discuss instructions and expectations.  I reminded Catherine Hebert to be prepared with any vital sign and/or heart rhythm information that  could potentially be obtained via home monitoring, at the time of her visit. I reminded Catherine Hebert to expect a phone call prior to her visit.  Ashland P Edwards 10/03/2018 1:21 PM   INSTRUCTIONS FOR DOWNLOADING THE MYCHART APP TO SMARTPHONE  - The patient must first make sure to have activated MyChart and know their login information - If Apple, go to [employer] and type in MyChart in the search bar and download the app. If Android, ask patient to go to [employer] and type in [place] in the search bar and download the app. The  app is free but as with any other app downloads, their phone may require them to verify saved payment information or Apple/Android password.  - The patient will need to then log into the app with their MyChart username and password, and select [facility] as their healthcare provider to link the account. When it is time for your visit, go to the MyChart app, find appointments, and click Begin Video Visit. Be sure to Select Allow for your device to access the Microphone and Camera for your visit. You will then be connected, and your provider will be with you shortly.  **If they have any issues connecting, or need assistance please contact MyChart service desk [phone])**  **If using a computer, in order to ensure the best quality for their visit they will need to use either of the following Internet Browsers: [employer], or Google Chrome**  IF USING DOXIMITY or DOXY.ME - The patient will receive a link just prior to their visit by text.     FULL LENGTH CONSENT FOR TELE-HEALTH VISIT   I hereby voluntarily request, consent and authorize [facility] and its employed or contracted physicians, physician assistants, nurse practitioners or other licensed health care professionals (the Practitioner), to provide me with telemedicine health care services (the Services") as deemed necessary by the treating Practitioner. I acknowledge and consent to receive the Services by the Practitioner via telemedicine. I understand that the telemedicine visit will involve communicating with the Practitioner through live audiovisual communication technology and the disclosure of certain medical information by electronic transmission. I acknowledge that I have been given the opportunity to request an in-person assessment or other available alternative prior to the telemedicine visit and am voluntarily participating in the telemedicine visit.  I understand that I have the right to withhold or withdraw  my consent to the use of telemedicine in the course of my care at any time, without affecting my right to future care or treatment, and that the Practitioner or I may terminate the telemedicine visit at any time. I understand that I have the right to inspect all information obtained and/or recorded in the course of the telemedicine visit and may receive copies of available information for a reasonable fee.  I understand that some of the potential risks of receiving the Services via telemedicine include:   Delay or interruption in medical evaluation due to technological equipment failure or disruption;  Information transmitted may not be sufficient (e.g. poor resolution of images) to allow for appropriate medical decision making by the Practitioner; and/or   In rare instances, security protocols could fail, causing a breach of personal health information.  Furthermore, I acknowledge that it is my responsibility to provide information about my medical history, conditions and care that is complete and accurate to the best of my ability. I acknowledge that Practitioner's advice, recommendations, and/or decision may be based on factors not within  their control, such as incomplete or inaccurate data provided by me or distortions of diagnostic images or specimens that may result from electronic transmissions. I understand that the practice of medicine is not an exact science and that Practitioner makes no warranties or guarantees regarding treatment outcomes. I acknowledge that I will receive a copy of this consent concurrently upon execution via email to the email address I last provided but may also request a printed copy by calling the office of [facility].    I understand that my insurance will be billed for this visit.   I have read or had this consent read to me.  I understand the contents of this consent, which adequately explains the benefits and risks of the Services being provided via  telemedicine.   I have been provided ample opportunity to ask questions regarding this consent and the Services and have had my questions answered to my satisfaction.  I give my informed consent for the services to be provided through the use of telemedicine in my medical care  By participating in this telemedicine visit I agree to the above.

## 2018-10-10 ENCOUNTER — Telehealth (INDEPENDENT_AMBULATORY_CARE_PROVIDER_SITE_OTHER): Payer: 59 | Admitting: Internal Medicine

## 2018-10-10 ENCOUNTER — Telehealth: Payer: Self-pay | Admitting: *Deleted

## 2018-10-10 ENCOUNTER — Other Ambulatory Visit: Payer: Self-pay

## 2018-10-10 DIAGNOSIS — I472 Ventricular tachycardia, unspecified: Secondary | ICD-10-CM

## 2018-10-10 MED ORDER — METOPROLOL SUCCINATE ER 25 MG PO TB24: 25.0000 mg | ORAL_TABLET | Freq: Every day | ORAL | 3 refills | Status: DC

## 2018-10-10 NOTE — Progress Notes (Signed)
Electrophysiology TeleHealth Note   Due to national recommendations of social distancing due to COVID 19, an audio/video telehealth visit is felt to be most appropriate for this patient at this time.  See MyChart message from today for the patient's consent to telehealth for [facility].   Date:  10/10/2018   ID:  Catherine Hebert, DOB [birth year], MRN [id number]  Location: patient's home  Provider location: [address] [place]  Evaluation Performed: Follow-up visit  PCP:  Burnard Bunting, MD  Cardiologist:  No primary care provider on file. none Electrophysiologist:  Dr Lovena Le  Chief Complaint:  " My I phone is not working well."  History of Present Illness:    Catherine Hebert is a 48 y.o. female who presents via audio/video conferencing for a telehealth visit today. 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 She has been off of her meds and has not seen me for a couple of years. She is pending EGD. She has normal LV function by echo a couple of years ago. He DM has been well controlled. Since last being seen in our clinic, the patient reports doing very well except for GI reflux.  Today, she denies symptoms of palpitations, chest pain, shortness of breath,  lower extremity edema, dizziness, presyncope, or syncope.  The patient is otherwise without complaint today.  The patient denies symptoms of fevers, chills, cough, or new SOB worrisome for COVID 19.  Past Medical History:  Diagnosis Date  . Bursitis of elbow   . Diabetes mellitus without complication ([place])   . Diverticulosis   . Esophageal reflux disease   . Esophageal stricture   . Fatigue   . GERD (gastroesophageal reflux disease)   . Hiatal hernia   . Hypertension   . Kidney stones   . Ovarian cyst     Past Surgical History:  Procedure Laterality Date  . CESAREAN SECTION  1998  . CESAREAN  SECTION  2003  . i&d back abscess  08/25/12  . kidney stone removal  1993    Current Outpatient Medications  Medication Sig Dispense Refill  . aspirin 81 MG tablet Take 81 mg by mouth daily.    . fluconazole (DIFLUCAN) 150 MG tablet Take 1 tablet by mouth daily as needed.    Marland Kitchen FLUoxetine (PROZAC) 40 MG capsule Take 40 mg by mouth daily.     . insulin aspart (NOVOLOG) 100 UNIT/ML injection Inject into the skin as directed. Per Insulin pump    . JARDIANCE 10 MG TABS tablet TK 1 T PO QD  6  . losartan-hydrochlorothiazide (HYZAAR) 50-12.5 MG tablet Take 1 tablet by mouth daily.    . metFORMIN (GLUCOPHAGE) 500 MG tablet Take 1,000 mg by mouth daily.     . ondansetron (ZOFRAN) 4 MG tablet as needed.    . ondansetron (ZOFRAN-ODT) 4 MG disintegrating tablet Take 1 tablet by mouth every 6 (six) hours as needed.    . pantoprazole (PROTONIX) 40 MG tablet 2 (two) times a day.     No current facility-administered medications for this visit.     Allergies:   Lisinopril   Social History:  The patient  reports that she quit smoking about 12 years ago. She has never used smokeless tobacco. She reports that she does not drink alcohol or use drugs.   Family History:  The patient's  family history  includes Diabetes in her father and mother; Heart attack in her cousin; Heart attack (age of onset: 72) in her mother; Heart attack (age of onset: 76) in her father; Sleep apnea in her father.   ROS:  Please see the history of present illness.   All other systems are personally reviewed and negative.    Exam:    Vital Signs:  There were no vitals taken for this visit.  Well appearing, alert and conversant, regular work of breathing,  good skin color Eyes- anicteric, neuro- grossly intact, skin- no apparent rash or lesions or cyanosis, mouth- oral mucosa is pink   Labs/Other Tests and Data Reviewed:    Recent Labs: No results found for requested labs within last 8760 hours.   Wt Readings from Last 3  Encounters:  10/02/18 230 lb (104.3 kg)  05/05/16 220 lb (99.8 kg)  03/02/16 222 lb (100.7 kg)     Other studies personally reviewed: none   ASSESSMENT & PLAN:    1.  VT/PVC's - she has had recurrent symptoms. She will re-initiation of toprol xl 25 mg daily.  2. Obesity - she has not lost weight.  3. HTN - she is encouraged to maintain a low sodium diet. 4. COVID 19 screen The patient denies symptoms of COVID 19 at this time.  The importance of social distancing was discussed today.  Follow-up:  3-4 months  Next remote: n/a  Current medicines are reviewed at length with the patient today.   The patient does not have concerns regarding her medicines.  The following changes were made today:  none  Labs/ tests ordered today include: none No orders of the defined types were placed in this encounter.    Patient Risk:  after full review of this patients clinical status, I feel that they are at moderate risk at this time.  Today, I have spent 15 minutes with the patient with telehealth technology discussing all of above.    Signed, Cristopher Peru, MD  10/10/2018 10:46 AM     [facility] [address] [place] [address] [phone] (office) [phone] (fax)

## 2018-10-10 NOTE — Telephone Encounter (Signed)
Covid-19 travel screening questions  Have you traveled in the last 14 days?no If yes where?  Do you now or have you had a fever in the last 14 days?no  Do you have any respiratory symptoms of shortness of breath or cough now or in the last 14 days?no  Do you have a medical history of Congestive Heart Failure?  Do you have a medical history of lung disease?  Do you have any family members or close contacts with diagnosed or suspected Covid-19?no  Patient made aware of care partner policy and was told to bring a mask with her if she has one available. SM

## 2018-10-11 ENCOUNTER — Encounter: Payer: Self-pay | Admitting: Internal Medicine

## 2018-10-11 ENCOUNTER — Other Ambulatory Visit: Payer: Self-pay

## 2018-10-11 ENCOUNTER — Ambulatory Visit (AMBULATORY_SURGERY_CENTER): Payer: 59 | Admitting: Internal Medicine

## 2018-10-11 VITALS — BP 177/78 | HR 96 | Temp 99.1°F | Resp 13 | Ht 66.0 in | Wt 230.0 lb

## 2018-10-11 DIAGNOSIS — K219 Gastro-esophageal reflux disease without esophagitis: Secondary | ICD-10-CM

## 2018-10-11 DIAGNOSIS — R112 Nausea with vomiting, unspecified: Secondary | ICD-10-CM

## 2018-10-11 MED ORDER — SODIUM CHLORIDE 0.9 % IV SOLN: 500.0000 mL | Freq: Once | INTRAVENOUS | Status: DC

## 2018-10-11 MED ORDER — FAMOTIDINE 20 MG PO TABS: ORAL_TABLET | ORAL | 11 refills | Status: DC

## 2018-10-11 NOTE — Op Note (Signed)
[facility] Patient Name: Arrion Burruel Procedure Date: 10/11/2018 2:27 PM MRN: [id number] Endoscopist: Docia Chuck. Henrene Pastor , MD Age: 48 Referring MD:  Date of Birth: 09-10-1970 Gender: Female Account #: [id number] Procedure:                Upper GI endoscopy Indications:              Esophageal reflux, Nausea with vomiting Medicines:                Monitored Anesthesia Care Procedure:                Pre-Anesthesia Assessment:                           - Prior to the procedure, a History and Physical                            was performed, and patient medications and                            allergies were reviewed. The patient's tolerance of                            previous anesthesia was also reviewed. The risks                            and benefits of the procedure and the sedation                            options and risks were discussed with the patient.                            All questions were answered, and informed consent                            was obtained. Prior Anticoagulants: The patient has                            taken no previous anticoagulant or antiplatelet                            agents. ASA Grade Assessment: III - A patient with                            severe systemic disease. After reviewing the risks                            and benefits, the patient was deemed in                            satisfactory condition to undergo the procedure.                           After obtaining informed consent, the endoscope was  passed under direct vision. Throughout the                            procedure, the patient's blood pressure, pulse, and                            oxygen saturations were monitored continuously. The                            Model GIF-HQ190 [id number]) scope was introduced                            through the mouth, and advanced to the second part                            of  duodenum. The upper GI endoscopy was                            accomplished without difficulty. The patient                            tolerated the procedure well. Scope In: Scope Out: Findings:                 The esophagus was normal.                           The stomach was normal.                           The examined duodenum was normal.                           The cardia and gastric fundus were normal on                            retroflexion. Complications:            No immediate complications. Estimated Blood Loss:     Estimated blood loss: none. Impression:               1. GERD                           2. Normal EGD Recommendation:           1. Reflux precautions                           2. Sustained weight loss                           3. Continue pantoprazole 40 mg twice daily                           4. Prescribe famotidine 20 mg; #30; 11 refills.                            Take 1  at bedtime                           5. SCHEDULE solid-phase gastric emptying scan "rule                            out gastroparesis".                           6. Office follow-up the telehealth visit/WebEx with                            Dr. Henrene Pastor in 4 weeks. Docia Chuck. Henrene Pastor, MD 10/11/2018 2:52:12 PM This report has been signed electronically.

## 2018-10-11 NOTE — Progress Notes (Signed)
PT taken to PACU. Monitors in place. VSS. Report given to RN. 

## 2018-10-11 NOTE — Progress Notes (Signed)
VS per Rica Mote CMA

## 2018-10-11 NOTE — Patient Instructions (Signed)
   Reflux precautions given to you today ( orange handout)  Gastric emptying scan will be scheduled by Dr Blanch Media office   Pick up new medicine Famotidine    YOU HAD AN ENDOSCOPIC PROCEDURE TODAY AT [facility]:   Refer to the procedure report that was given to you for any specific questions about what was found during the examination.  If the procedure report does not answer your questions, please call your gastroenterologist to clarify.  If you requested that your care partner not be given the details of your procedure findings, then the procedure report has been included in a sealed envelope for you to review at your convenience later.  YOU SHOULD EXPECT: Some feelings of bloating in the abdomen. Passage of more gas than usual.  Walking can help get rid of the air that was put into your GI tract during the procedure and reduce the bloating. If you had a lower endoscopy (such as a colonoscopy or flexible sigmoidoscopy) you may notice spotting of blood in your stool or on the toilet paper. If you underwent a bowel prep for your procedure, you may not have a normal bowel movement for a few days.  Please Note:  You might notice some irritation and congestion in your nose or some drainage.  This is from the oxygen used during your procedure.  There is no need for concern and it should clear up in a day or so.  SYMPTOMS TO REPORT IMMEDIATELY:      Following upper endoscopy (EGD)  Vomiting of blood or coffee ground material  New chest pain or pain under the shoulder blades  Painful or persistently difficult swallowing  New shortness of breath  Fever of 100F or higher  Black, tarry-looking stools  For urgent or emergent issues, a gastroenterologist can be reached at any hour by calling [phone].   DIET:  We do recommend a small meal at first, but then you may proceed to your regular diet.  Drink plenty of fluids but you should avoid alcoholic beverages for 24  hours.  ACTIVITY:  You should plan to take it easy for the rest of today and you should NOT DRIVE or use heavy machinery until tomorrow (because of the sedation medicines used during the test).    FOLLOW UP: Our staff will call the number listed on your records 48-72 hours following your procedure to check on you and address any questions or concerns that you may have regarding the information given to you following your procedure. If we do not reach you, we will leave a message.  We will attempt to reach you two times.  During this call, we will ask if you have developed any symptoms of COVID 19. If you develop any symptoms (for example fever, flu-like symptoms, shortness of breath, cough etc.) before then, please call [phone].  If any biopsies were taken you will be contacted by phone or by letter within the next 1-3 weeks.  Please call us at [phone] if you have not heard about the biopsies in 3 weeks.    SIGNATURES/CONFIDENTIALITY: You and/or your care partner have signed paperwork which will be entered into your electronic medical record.  These signatures attest to the fact that that the information above on your After Visit Summary has been reviewed and is understood.  Full responsibility of the confidentiality of this discharge information lies with you and/or your care-partner.

## 2018-10-12 ENCOUNTER — Telehealth: Payer: Self-pay

## 2018-10-12 ENCOUNTER — Other Ambulatory Visit: Payer: Self-pay

## 2018-10-12 DIAGNOSIS — K219 Gastro-esophageal reflux disease without esophagitis: Secondary | ICD-10-CM

## 2018-10-12 DIAGNOSIS — R112 Nausea with vomiting, unspecified: Secondary | ICD-10-CM

## 2018-10-12 NOTE — Telephone Encounter (Signed)
Pt scheduled for GES at [facility] 10/31/18@7 :30am, pt to arrive there at 7:15am. Pt needs to hold stomach meds 8 hours prior to scan and be NPO after midnight. Pt scheduled for webex visit with Dr. Henrene Pastor 11/09/18@8 :30am. Letter mailed to pt.

## 2018-10-13 ENCOUNTER — Telehealth: Payer: Self-pay | Admitting: *Deleted

## 2018-10-13 NOTE — Telephone Encounter (Signed)
  Follow up Call-  Call back number 10/11/2018  Post procedure Call Back phone  # [phone]  Permission to leave phone message Yes  Some recent data might be hidden     Patient questions:  Do you have a fever, pain , or abdominal swelling? no Pain Score  0  Have you tolerated food without any problems? yes  Have you been able to return to your normal activities? yes  Do you have any questions about your discharge instructions: Diet   no Medications  no Follow up visit  no  Do you have questions or concerns about your Care? no  Actions: * If pain score is 4 or above: No  1. Have you developed a fever since your procedure? no  2.   Have you had an respiratory symptoms (SOB or cough) since your procedure? no  3.   Have you tested positive for COVID 19 since your procedure no  3.   Have you had any family members/close contacts diagnosed with the COVID 19 since your procedure?  no   If any of these questions are a yes, please inquire if patient has been seen by family doctor and route this note to Joylene John, Therapist, sports.

## 2018-10-13 NOTE — Telephone Encounter (Signed)
  Follow up Call-  Call back number 10/11/2018  Post procedure Call Back phone  # [phone]  Permission to leave phone message Yes  Some recent data might be hidden    [facility]

## 2018-10-31 ENCOUNTER — Other Ambulatory Visit: Payer: Self-pay

## 2018-10-31 ENCOUNTER — Ambulatory Visit (HOSPITAL_COMMUNITY)
Admission: RE | Admit: 2018-10-31 | Discharge: 2018-10-31 | Disposition: A | Discharge status: Home / Self Care | Payer: 59 | Source: Ambulatory Visit | Attending: Internal Medicine | Admitting: Internal Medicine

## 2018-10-31 DIAGNOSIS — R112 Nausea with vomiting, unspecified: Secondary | ICD-10-CM | POA: Insufficient documentation

## 2018-10-31 DIAGNOSIS — K219 Gastro-esophageal reflux disease without esophagitis: Secondary | ICD-10-CM | POA: Insufficient documentation

## 2018-10-31 MED ORDER — TECHNETIUM TC 99M SULFUR COLLOID
2.0000 | Freq: Once | INTRAVENOUS | Status: AC | PRN
  Administered 2018-10-31: 2 via INTRAVENOUS

## 2018-11-08 ENCOUNTER — Telehealth: Payer: Self-pay

## 2018-11-08 NOTE — Telephone Encounter (Signed)
Patient had trouble with Webex for her last appointment and wanted to change to a phone visit.  Dr. Henrene Pastor specifically wants this to be Webex so patient is going to try to use her computer instead of her phone to see if that helps.

## 2018-11-08 NOTE — Telephone Encounter (Signed)
Screening prior to phone visit complete

## 2018-11-09 ENCOUNTER — Ambulatory Visit (INDEPENDENT_AMBULATORY_CARE_PROVIDER_SITE_OTHER): Payer: 59 | Admitting: Internal Medicine

## 2018-11-09 ENCOUNTER — Other Ambulatory Visit: Payer: Self-pay

## 2018-11-09 ENCOUNTER — Encounter: Payer: Self-pay | Admitting: Internal Medicine

## 2018-11-09 VITALS — Ht 66.0 in | Wt 230.0 lb

## 2018-11-09 DIAGNOSIS — Z794 Long term (current) use of insulin: Secondary | ICD-10-CM

## 2018-11-09 DIAGNOSIS — R112 Nausea with vomiting, unspecified: Secondary | ICD-10-CM

## 2018-11-09 DIAGNOSIS — E119 Type 2 diabetes mellitus without complications: Secondary | ICD-10-CM

## 2018-11-09 DIAGNOSIS — K3184 Gastroparesis: Secondary | ICD-10-CM

## 2018-11-09 DIAGNOSIS — IMO0001 Reserved for inherently not codable concepts without codable children: Secondary | ICD-10-CM

## 2018-11-09 DIAGNOSIS — K219 Gastro-esophageal reflux disease without esophagitis: Secondary | ICD-10-CM

## 2018-11-09 DIAGNOSIS — E1143 Type 2 diabetes mellitus with diabetic autonomic (poly)neuropathy: Secondary | ICD-10-CM

## 2018-11-09 MED ORDER — METOCLOPRAMIDE HCL 10 MG PO TABS: 10.0000 mg | ORAL_TABLET | Freq: Three times a day (TID) | ORAL | 3 refills | Status: DC

## 2018-11-09 NOTE — Progress Notes (Signed)
HISTORY OF PRESENT ILLNESS:  Catherine Hebert is a 48 y.o. female with longstanding diabetes mellitus, morbid obesity, and GERD complicated by peptic stricture requiring esophageal dilation who schedules this A/V WebEx appointment during the coronavirus pandemic for follow-up regarding problems with worsening reflux associated with nausea and intermittent vomiting and regurgitation.  She was seen in this office Oct 02, 2018 regarding same.  See that dictation.  She subsequently underwent upper endoscopy on Oct 11, 2018.  Examination was normal.  She was continued on pantoprazole 40 mg twice daily.  Famotidine 20 mg at night was added.  She was advised with regards to reflux precautions.  She subsequently underwent gastric emptying scan (solid-phase) x-ray study which I have reviewed.  She was found to have severe gastroparesis.  Patient tells me that she does better with small meals.  Overall her symptoms have improved though she is still troubled twice per week.  No new complaints.  She has a number of questions regarding the findings on her endoscopy and x-ray test.  Review of relevant laboratory shows most recent hemoglobin A1c 7.6.  She is known to have probable small gallstones as well as fatty liver on ultrasound.  REVIEW OF SYSTEMS:  All non-GI ROS negative unless otherwise stated in the HPI except for allergies  Past Medical History:  Diagnosis Date  . Allergic rhinitis   . Bursitis of elbow   . Diabetes mellitus without complication ([place]) 2297  . Diverticulosis   . Esophageal reflux disease   . Esophageal stricture   . Fatigue   . GERD (gastroesophageal reflux disease)   . Hiatal hernia   . Hypertension   . Kidney stones   . Ovarian cyst   . Sleep apnea   . Tachycardia 2017   ventricular tachycardia     Past Surgical History:  Procedure Laterality Date  . CESAREAN SECTION  1998  . CESAREAN SECTION  2003  . i&d back abscess  08/25/12  . kidney stone removal  1993  . UPPER  GASTROINTESTINAL ENDOSCOPY      Social History Catherine Hebert  reports that she quit smoking about 12 years ago. She has never used smokeless tobacco. She reports that she does not drink alcohol or use drugs.  family history includes Diabetes in her father and mother; Heart attack in her cousin; Heart attack (age of onset: 66) in her mother; Heart attack (age of onset: 28) in her father; Sleep apnea in her father.  Allergies  Allergen Reactions  . Lisinopril     cough       PHYSICAL EXAMINATION: Looks well.  Alert and oriented.  Cooperative No additional physical exam information with WebEx telehealth visit   ASSESSMENT:  1.  GERD.  Deteriorated.  Secondary to weight gain and diabetic gastroparesis. 2.  Diabetic gastroparesis.  Severe by gastric emptying scan criteria 3.  Multiple significant medical problems 4.  Morbid obesity   PLAN:  1.  Continue pantoprazole 40 mg twice daily 2.  Continue famotidine 20 mg at night 3.  PRESCRIBE METOCLOPRAMIDE (Reglan) 10 mg before meals and at bedtime; #120; 3 refills.  I discussed with her the most significant medication risks as well as usual side effects. 4.  Eat small meals 5.  Weight reduction.  I would like to see her lose 30 pounds minimal 6.  OFFICE FOLLOW-UP in 4 weeks This WebEx audiovisual telehealth medicine visit was initiated by the patient and consented for by the patient who was in her home  while I was in my office during the encounter.  She understands her may be professional charge associated with this service which took approximately 25 minutes

## 2018-11-09 NOTE — Addendum Note (Signed)
Addended by: Audrea Muscat on: 11/09/2018 10:35 AM   Modules accepted: Orders

## 2018-11-09 NOTE — Patient Instructions (Addendum)
1.  Continue pantoprazole 40 mg twice daily  2.  Continue famotidine 20 mg at night  3.  PRESCRIBE METOCLOPRAMIDE (Reglan) 10 mg before meals and at bedtime; #120; 3 refills.  I discussed with her the most significant medication risks as well as usual side effects.  I have sent this medication to your pharmacy.  4.  Eat small meals  5.  Weight reduction.  I would like to see her lose 30 pounds minimal  6.  OFFICE FOLLOW-UP in 4 weeks.  I have scheduled you for a webex visit on 12/08/2018 at 8:30.  If this is not convenient just call the office to reschedule.

## 2018-12-07 ENCOUNTER — Telehealth: Payer: Self-pay

## 2018-12-07 NOTE — Telephone Encounter (Signed)
Phone screening complete 

## 2018-12-07 NOTE — Telephone Encounter (Signed)
Lm on mobile #.

## 2018-12-07 NOTE — Telephone Encounter (Signed)
Lm on vm 

## 2018-12-08 ENCOUNTER — Other Ambulatory Visit: Payer: Self-pay

## 2018-12-08 ENCOUNTER — Ambulatory Visit (INDEPENDENT_AMBULATORY_CARE_PROVIDER_SITE_OTHER): Payer: 59 | Admitting: Internal Medicine

## 2018-12-08 ENCOUNTER — Encounter: Payer: Self-pay | Admitting: Internal Medicine

## 2018-12-08 VITALS — Ht 66.0 in | Wt 230.0 lb

## 2018-12-08 DIAGNOSIS — E1143 Type 2 diabetes mellitus with diabetic autonomic (poly)neuropathy: Secondary | ICD-10-CM | POA: Diagnosis not present

## 2018-12-08 DIAGNOSIS — R112 Nausea with vomiting, unspecified: Secondary | ICD-10-CM | POA: Diagnosis not present

## 2018-12-08 DIAGNOSIS — K3184 Gastroparesis: Secondary | ICD-10-CM

## 2018-12-08 DIAGNOSIS — E119 Type 2 diabetes mellitus without complications: Secondary | ICD-10-CM

## 2018-12-08 DIAGNOSIS — IMO0001 Reserved for inherently not codable concepts without codable children: Secondary | ICD-10-CM

## 2018-12-08 DIAGNOSIS — K219 Gastro-esophageal reflux disease without esophagitis: Secondary | ICD-10-CM

## 2018-12-08 DIAGNOSIS — Z794 Long term (current) use of insulin: Secondary | ICD-10-CM

## 2018-12-08 NOTE — Progress Notes (Signed)
HISTORY OF PRESENT ILLNESS:  Catherine Hebert is a 48 y.o. female with longstanding diabetes mellitus, obesity, and GERD complicated by peptic stricture who schedules a WebEx A/V visit during the coronavirus pandemic for follow-up of her worsening reflux disease.  She was last seen in the office virtually November 09, 2018 after having undergone upper endoscopy Oct 13, 2018.  Endoscopy was normal.  Most recent hemoglobin A1c level 7.6.  Gastric emptying scan performed October 31, 2018 revealed marked delayed gastric emptying.  She was continued on pantoprazole twice daily and famotidine at night.  Metoclopramide 10 mg before meals and at night was added.  Strict review of reflux precautions including weight loss and strict diabetic control emphasized.  She presents today for routine follow-up.  Overall she is doing much better.  Her nausea is much less.  Only rare episodes of vomiting.  However, nighttime seems to be problematic.  She does describe nonspecific anxiety which I wonder might not be related to the metoclopramide.  She did take 1 of her husband's lorazepam tablets which helped.  She has been doing better with regards to eating habits.  Her bed is elevated (pillows) and she is not eating many hours prior to bedtime.  Despite this she will wake up with nausea.  As well, if she has a low blood sugar needs to take a small snack she will not be able to lie back down thereafter due to regurgitant symptoms.  Other than the aforementioned anxiety, no additional or new complaints.  REVIEW OF SYSTEMS:  All non-GI ROS negative unless otherwise stated in the HPI.  Past Medical History:  Diagnosis Date  . Allergic rhinitis   . Bursitis of elbow   . Diabetes mellitus without complication ([place]) 2956  . Diverticulosis   . Esophageal reflux disease   . Esophageal stricture   . Fatigue   . GERD (gastroesophageal reflux disease)   . Hiatal hernia   . Hypertension   . Kidney stones   . Ovarian cyst   . Sleep  apnea   . Tachycardia 2017   ventricular tachycardia     Past Surgical History:  Procedure Laterality Date  . CESAREAN SECTION  1998  . CESAREAN SECTION  2003  . i&d back abscess  08/25/12  . kidney stone removal  1993  . UPPER GASTROINTESTINAL ENDOSCOPY      Social History BRAD MCGAUGHY  reports that she quit smoking about 12 years ago. She has never used smokeless tobacco. She reports that she does not drink alcohol or use drugs.  family history includes Diabetes in her father and mother; Heart attack in her cousin; Heart attack (age of onset: 16) in her mother; Heart attack (age of onset: 36) in her father; Sleep apnea in her father.  Allergies  Allergen Reactions  . Lisinopril     cough       PHYSICAL EXAMINATION: No physical examination with telehealth visit   ASSESSMENT:  1.  GERD.  Exacerbated by diabetic gastroparesis.  Improved with medical therapy and lifestyle modifications, though still with significant nocturnal symptoms 2.  Diabetic gastroparesis 3.  Obesity 4.  Insulin requiring diabetes.  Last hemoglobin A1c 7.6  PLAN:  1.  Reflux precautions.  She has been diligently working on these entities. 2.  Continue pantoprazole 40 mg twice daily 3.  Continue famotidine 20 mg at night 4.  I have asked her to hold her daytime metoclopramide for a few days to see if this helps with  her anxiety.  As well, she has not been taking metoclopramide at bedtime.  I have asked her to take a dose at bedtime.  If she finds that her symptoms are worse after holding daytime metoclopramide, then she might resume therapies.  She will experiment with this a bit as we discussed. 5.  Weight loss.  She is working on this 6.  Good diabetic control.  She continues to work on this with Dr. Reynaldo Minium 7.  GI office follow-up in 3 months.  Contact the office in the interim for any questions or problems This WebEx A/V visit was initiated by and consented for by the patient who was in her  home while I was in my office during the encounter.  She understands her may be an associated charge for this professional service totaling 15 minutes

## 2018-12-08 NOTE — Patient Instructions (Addendum)
1.  Reflux precautions.    2.  Continue pantoprazole 40 mg twice daily  3.  Continue famotidine 20 mg at night  4.  I have asked her to hold her daytime metoclopramide for a few days to see if this helps with her anxiety.  As well, she has not been taking metoclopramide at bedtime.  I have asked her to take a dose at bedtime.  If she finds that her symptoms are worse after holding daytime metoclopramide, then she might resume therapies.  She will experiment with this a bit as we discussed.  5.  Weight loss.  She is working on this  6.  Good diabetic control.  She continues to work on this with Dr. Reynaldo Minium  7. Please follow up with Dr. Henrene Pastor in 3 months.  His schedule does not go out that far right now so please call next month to set this up.  Contact the office with any questions or concerns in the interim.

## 2019-01-19 ENCOUNTER — Ambulatory Visit: Payer: 59 | Admitting: Internal Medicine

## 2019-04-10 ENCOUNTER — Ambulatory Visit: Payer: 59 | Admitting: Internal Medicine

## 2019-04-26 ENCOUNTER — Other Ambulatory Visit: Payer: Self-pay | Admitting: Internal Medicine

## 2019-04-30 ENCOUNTER — Other Ambulatory Visit: Payer: Self-pay | Admitting: General Surgery

## 2019-08-03 ENCOUNTER — Other Ambulatory Visit: Payer: Self-pay

## 2019-08-03 MED ORDER — FAMOTIDINE 20 MG PO TABS: ORAL_TABLET | ORAL | 6 refills | Status: DC

## 2019-10-14 ENCOUNTER — Other Ambulatory Visit: Payer: Self-pay | Admitting: Internal Medicine

## 2019-10-31 ENCOUNTER — Other Ambulatory Visit: Payer: Self-pay

## 2019-10-31 ENCOUNTER — Encounter: Payer: Self-pay | Admitting: Student

## 2019-10-31 ENCOUNTER — Ambulatory Visit (INDEPENDENT_AMBULATORY_CARE_PROVIDER_SITE_OTHER): Payer: 59 | Admitting: Student

## 2019-10-31 ENCOUNTER — Telehealth: Payer: Self-pay

## 2019-10-31 VITALS — BP 140/82 | HR 97 | Ht 66.0 in | Wt 238.0 lb

## 2019-10-31 DIAGNOSIS — I10 Essential (primary) hypertension: Secondary | ICD-10-CM

## 2019-10-31 DIAGNOSIS — I472 Ventricular tachycardia, unspecified: Secondary | ICD-10-CM

## 2019-10-31 DIAGNOSIS — E669 Obesity, unspecified: Secondary | ICD-10-CM | POA: Diagnosis not present

## 2019-10-31 DIAGNOSIS — Z6838 Body mass index (BMI) 38.0-38.9, adult: Secondary | ICD-10-CM

## 2019-10-31 NOTE — Patient Instructions (Addendum)
Medication Instructions:  none *If you need a refill on your cardiac medications before your next appointment, please call your pharmacy*   Lab Work: none If you have labs (blood work) drawn today and your tests are completely normal, you will receive your results only by: Marland Kitchen MyChart Message (if you have MyChart) OR . A paper copy in the mail If you have any lab test that is abnormal or we need to change your treatment, we will call you to review the results.   Testing/Procedures: none   Follow-Up: At [facility], you and your health needs are our priority.  As part of our continuing mission to provide you with exceptional heart care, we have created designated Provider Care Teams.  These Care Teams include your primary Cardiologist (physician) and Advanced Practice Providers (APPs -  Physician Assistants and Nurse Practitioners) who all work together to provide you with the care you need, when you need it.  We recommend signing up for the patient portal called "MyChart".  Sign up information is provided on this After Visit Summary.  MyChart is used to connect with patients for Virtual Visits (Telemedicine).  Patients are able to view lab/test results, encounter notes, upcoming appointments, etc.  Non-urgent messages can be sent to your provider as well.   To learn more about what you can do with MyChart, go to [email or web address].    Your next appointment:   1 YEAR  The format for your next appointment:   Either In Person or Virtual  Provider:   Dr Lovena Le   Other Instructions

## 2019-10-31 NOTE — Telephone Encounter (Signed)
Lpm  to remind of 1 pm appointment.

## 2019-10-31 NOTE — Progress Notes (Signed)
PCP:  Burnard Bunting, MD Primary Cardiologist: No primary care provider on file. Electrophysiologist: Cristopher Peru, MD   Catherine Hebert is a 49 y.o. female seen today for Cristopher Peru, MD for routine electrophysiology followup.  Since last being seen in our clinic the patient reports doing well apart from dealing with the pandemic. She has been working from home since last march, and "hates it". She is very excited to go back to work in 2 weeks. She was recently put on medications for depression and feels like this has helped. she denies exertional chest pain, palpitations, dyspnea, PND, orthopnea, nausea, vomiting, dizziness, syncope, edema, weight gain, or early satiety.  Past Medical History:  Diagnosis Date  . Allergic rhinitis   . Bursitis of elbow   . Diabetes mellitus without complication ([place]) 99991111  . Diverticulosis   . Esophageal reflux disease   . Esophageal stricture   . Fatigue   . GERD (gastroesophageal reflux disease)   . Hiatal hernia   . Hypertension   . Kidney stones   . Ovarian cyst   . Sleep apnea   . Tachycardia 2017   ventricular tachycardia    Past Surgical History:  Procedure Laterality Date  . CESAREAN SECTION  1998  . CESAREAN SECTION  2003  . i&d back abscess  08/25/12  . kidney stone removal  1993  . UPPER GASTROINTESTINAL ENDOSCOPY      Current Outpatient Medications  Medication Sig Dispense Refill  . aspirin 81 MG tablet Take 81 mg by mouth daily.    Marland Kitchen buPROPion (WELLBUTRIN XL) 150 MG 24 hr tablet Take 150 mg by mouth every morning.    . Continuous Blood Gluc Sensor (FREESTYLE LIBRE 14 DAY SENSOR) MISC Apply 1 Device topically daily.    . famotidine (PEPCID) 20 MG tablet Famotidine 20 mg- take one at bedtime 30 tablet 6  . FLUoxetine (PROZAC) 40 MG capsule Take 40 mg by mouth daily.     Marland Kitchen JARDIANCE 10 MG TABS tablet TK 1 T PO QD  6  . LANTUS SOLOSTAR 100 UNIT/ML Solostar Pen daily. 150 units per day    . losartan-hydrochlorothiazide  (HYZAAR) 50-12.5 MG tablet Take 1 tablet by mouth daily.    . metFORMIN (GLUCOPHAGE) 500 MG tablet Take 1,000 mg by mouth daily.     . metoCLOPramide (REGLAN) 10 MG tablet TAKE 1 TABLET(10 MG) BY MOUTH FOUR TIMES DAILY BEFORE MEALS AND AT BEDTIME 120 tablet 3  . metoprolol succinate (TOPROL-XL) 25 MG 24 hr tablet Take 1 tablet (25 mg total) by mouth daily. Please keep upcoming appt for future refills 90 tablet 3  . ondansetron (ZOFRAN) 4 MG tablet as needed.    . pantoprazole (PROTONIX) 40 MG tablet 2 (two) times a day.     No current facility-administered medications for this visit.    Allergies  Allergen Reactions  . Lisinopril     cough    Social History   Socioeconomic History  . Marital status: Married    Spouse name: Not on file  . Number of children: 2  . Years of education: College  . Highest education level: Not on file  Occupational History  . Not on file  Tobacco Use  . Smoking status: Former Smoker    Quit date: 08/26/2006    Years since quitting: 13.1  . Smokeless tobacco: Never Used  Substance and Sexual Activity  . Alcohol use: No    Alcohol/week: 0.0 standard drinks  . Drug use: No  .  Sexual activity: Not on file  Other Topics Concern  . Not on file  Social History Narrative   Drinks 2 cups of coffee a day    Social Determinants of Health   Financial Resource Strain:   . Difficulty of Paying Living Expenses:   Food Insecurity:   . Worried About Charity fundraiser in the Last Year:   . Arboriculturist in the Last Year:   Transportation Needs:   . Film/video editor (Medical):   Marland Kitchen Lack of Transportation (Non-Medical):   Physical Activity:   . Days of Exercise per Week:   . Minutes of Exercise per Session:   Stress:   . Feeling of Stress :   Social Connections:   . Frequency of Communication with Friends and Family:   . Frequency of Social Gatherings with Friends and Family:   . Attends Religious Services:   . Active Member of Clubs or  Organizations:   . Attends Archivist Meetings:   Marland Kitchen Marital Status:   Intimate Partner Violence:   . Fear of Current or Ex-Partner:   . Emotionally Abused:   Marland Kitchen Physically Abused:   . Sexually Abused:      Review of Systems: All other systems reviewed and are otherwise negative except as noted above.  Physical Exam: Vitals:   10/31/19 1310  BP: 140/82  Pulse: 97  SpO2: 96%  Weight: 238 lb (108 kg)  Height: 5\' 6"  (1.676 m)    GEN- The patient is well appearing, alert and oriented x 3 today.   HEENT: normocephalic, atraumatic; sclera clear, conjunctiva pink; hearing intact; oropharynx clear; neck supple, no JVP Lymph- no cervical lymphadenopathy Lungs- Clear to ausculation bilaterally, normal work of breathing.  No wheezes, rales, rhonchi Heart- Regular rate and rhythm, no murmurs, rubs or gallops, PMI not laterally displaced GI- soft, non-tender, non-distended, bowel sounds present, no hepatosplenomegaly Extremities- no clubbing, cyanosis, or edema; DP/PT/radial pulses 2+ bilaterally MS- no significant deformity or atrophy Skin- warm and dry, no rash or lesion Psych- euthymic mood, full affect Neuro- strength and sensation are intact  EKG is ordered. Personal review of EKG from today shows normal sinus rhythm at 97 bpm QRS 116 ms (incomplete LBBB) and PR interval 170 ms  Additional studies reviewed include: Echo 10/2015 LVEF 55-60%, Previous EP office notes  Assessment and Plan:  1. VT/PVCs Quiescent on toprol.  No change at this time.  2. Obesity Body mass index is 38.41 kg/m.  Encouraged weight loss through exercise and considering the [facility] or [facility]  3. HTN Stable at home in 120s. No change today.   RTC annually with Dr. Lovena Le. Sooner with symptoms.   Shirley Friar, PA-C  10/31/19 2:05 PM

## 2019-11-09 ENCOUNTER — Telehealth: Payer: Self-pay | Admitting: Internal Medicine

## 2019-11-09 MED ORDER — PANTOPRAZOLE SODIUM 40 MG PO TBEC: 40.0000 mg | DELAYED_RELEASE_TABLET | Freq: Two times a day (BID) | ORAL | 3 refills | Status: DC

## 2019-11-09 NOTE — Telephone Encounter (Signed)
Patient needs to refill Pantoprazole. Please send it to [facility] on [address]

## 2019-11-09 NOTE — Telephone Encounter (Signed)
Refilled Pantoprazole 

## 2020-05-10 ENCOUNTER — Other Ambulatory Visit: Payer: Self-pay | Admitting: Internal Medicine

## 2020-05-13 ENCOUNTER — Telehealth: Payer: Self-pay | Admitting: Internal Medicine

## 2020-05-13 MED ORDER — METOCLOPRAMIDE HCL 10 MG PO TABS: ORAL_TABLET | ORAL | 3 refills | Status: DC

## 2020-05-13 NOTE — Telephone Encounter (Signed)
Refilled Reglan

## 2020-05-13 NOTE — Telephone Encounter (Signed)
Pt is requesting a refill on her REGLAN (pt is scheduled for 12/17)

## 2020-05-16 ENCOUNTER — Ambulatory Visit: Payer: 59 | Admitting: Physician Assistant

## 2020-06-02 ENCOUNTER — Other Ambulatory Visit: Payer: Self-pay | Admitting: Internal Medicine

## 2020-06-26 ENCOUNTER — Ambulatory Visit: Payer: 59 | Admitting: Physician Assistant

## 2020-08-12 ENCOUNTER — Other Ambulatory Visit: Payer: Self-pay

## 2020-08-12 MED ORDER — METOPROLOL SUCCINATE ER 25 MG PO TB24: 25.0000 mg | ORAL_TABLET | Freq: Every day | ORAL | 0 refills | Status: DC

## 2020-08-19 ENCOUNTER — Ambulatory Visit: Payer: 59 | Admitting: Internal Medicine

## 2020-08-27 ENCOUNTER — Ambulatory Visit (INDEPENDENT_AMBULATORY_CARE_PROVIDER_SITE_OTHER): Payer: 59 | Admitting: Physician Assistant

## 2020-08-27 ENCOUNTER — Encounter: Payer: Self-pay | Admitting: Physician Assistant

## 2020-08-27 VITALS — BP 122/70 | HR 88 | Ht 66.0 in | Wt 247.4 lb

## 2020-08-27 DIAGNOSIS — K219 Gastro-esophageal reflux disease without esophagitis: Secondary | ICD-10-CM | POA: Diagnosis not present

## 2020-08-27 DIAGNOSIS — K3184 Gastroparesis: Secondary | ICD-10-CM | POA: Diagnosis not present

## 2020-08-27 DIAGNOSIS — Z1212 Encounter for screening for malignant neoplasm of rectum: Secondary | ICD-10-CM

## 2020-08-27 DIAGNOSIS — Z1211 Encounter for screening for malignant neoplasm of colon: Secondary | ICD-10-CM | POA: Diagnosis not present

## 2020-08-27 MED ORDER — METOCLOPRAMIDE HCL 10 MG PO TABS: ORAL_TABLET | ORAL | 3 refills | Status: DC

## 2020-08-27 MED ORDER — PANTOPRAZOLE SODIUM 40 MG PO TBEC: 40.0000 mg | DELAYED_RELEASE_TABLET | Freq: Two times a day (BID) | ORAL | 3 refills | Status: DC

## 2020-08-27 MED ORDER — FAMOTIDINE 20 MG PO TABS
ORAL_TABLET | ORAL | 3 refills | Status: DC
Start: 2020-08-27 — End: 2021-09-21

## 2020-08-27 MED ORDER — NA SULFATE-K SULFATE-MG SULF 17.5-3.13-1.6 GM/177ML PO SOLN: 1.0000 | Freq: Once | ORAL | 0 refills | Status: AC

## 2020-08-27 NOTE — Progress Notes (Signed)
Assessment and plan reviewed.  Smaller meal size and weight loss may be helpful

## 2020-08-27 NOTE — Progress Notes (Signed)
Chief Complaint: Medication refill  HPI:    Catherine Hebert is a 50 year old female with a past medical history as listed below including reflux, known to Dr. Henrene Pastor, who presents to clinic today for medication refill.    10/13/2018 EGD was normal.    10/31/2018 gastric emptying study with delayed gastric emptying.    12/08/2018 patient seen via telemedicine visit by Dr. Henrene Pastor for her history of reflux complicated by peptic stricture and worsening symptoms.  At that time is taking pantoprazole twice daily and famotidine at night as well as metoclopramide 10 mg before meals and at night.  At that time she discussed some anxiety and it was recommended that she try holding her daytime metoclopramide for a few days to see if this helps.  Was recommended she go ahead and take her dose at bedtime.  Continued on Pantoprazole 40 twice daily and Famotidine 20 mg at night.    Today, the patient tells me that she has not been taking her Metoclopramide as prescribed.  Recently she was taking three 10 mg tabs at night before going to sleep.  She tells me she thinks this made all of her symptoms worse.  So she decided to decrease to 2 per night but still has daily vomiting symptoms and tells me that she vomits up phlegm and is easily gagged.  Also reflux symptoms.    Denies previous colonoscopy.    Denies fever, chills, weight loss, blood in her stool, change in bowel habits or symptoms that awaken her from sleep.  Past Medical History:  Diagnosis Date  . Allergic rhinitis   . Bursitis of elbow   . Diabetes mellitus without complication ([place]) 2423  . Diverticulosis   . Esophageal reflux disease   . Esophageal stricture   . Fatigue   . GERD (gastroesophageal reflux disease)   . Hiatal hernia   . Hypertension   . Kidney stones   . Ovarian cyst   . Sleep apnea   . Tachycardia 2017   ventricular tachycardia     Past Surgical History:  Procedure Laterality Date  . CESAREAN SECTION  1998  . CESAREAN  SECTION  2003  . i&d back abscess  08/25/12  . kidney stone removal  1993  . UPPER GASTROINTESTINAL ENDOSCOPY      Current Outpatient Medications  Medication Sig Dispense Refill  . aspirin 81 MG tablet Take 81 mg by mouth daily.    Marland Kitchen buPROPion (WELLBUTRIN XL) 150 MG 24 hr tablet Take 150 mg by mouth every morning.    . Continuous Blood Gluc Sensor (FREESTYLE LIBRE 14 DAY SENSOR) MISC Apply 1 Device topically daily.    . famotidine (PEPCID) 20 MG tablet TAKE 1 TABLET BY MOUTH AT BEDTIME 30 tablet 2  . FLUoxetine (PROZAC) 40 MG capsule Take 40 mg by mouth daily.     Marland Kitchen JARDIANCE 10 MG TABS tablet TK 1 T PO QD  6  . LANTUS SOLOSTAR 100 UNIT/ML Solostar Pen daily. 150 units per day    . losartan-hydrochlorothiazide (HYZAAR) 50-12.5 MG tablet Take 1 tablet by mouth daily.    . metFORMIN (GLUCOPHAGE) 500 MG tablet Take 1,000 mg by mouth daily.     . metoCLOPramide (REGLAN) 10 MG tablet TAKE 1 TABLET(10 MG) BY MOUTH FOUR TIMES DAILY BEFORE MEALS AND AT BEDTIME 120 tablet 3  . metoprolol succinate (TOPROL-XL) 25 MG 24 hr tablet Take 1 tablet (25 mg total) by mouth daily. Please make yearly appt with Dr. Lovena Le for  June 2022 for future refills. Thank you 1st attempt 90 tablet 0  . ondansetron (ZOFRAN) 4 MG tablet as needed.    . pantoprazole (PROTONIX) 40 MG tablet Take 1 tablet (40 mg total) by mouth 2 (two) times daily. 60 tablet 3   No current facility-administered medications for this visit.    Allergies as of 08/27/2020 - Review Complete 10/31/2019  Allergen Reaction Noted  . Lisinopril  08/25/2012    Family History  Problem Relation Age of Onset  . Diabetes Mother   . Heart attack Mother 23  . Diabetes Father   . Sleep apnea Father   . Heart attack Father 68  . Heart attack Cousin   . Colon cancer Neg Hx   . Stomach cancer Neg Hx   . Rectal cancer Neg Hx   . Esophageal cancer Neg Hx   . Colon polyps Neg Hx     Social History   Socioeconomic History  . Marital status:  Married    Spouse name: Not on file  . Number of children: 2  . Years of education: College  . Highest education level: Not on file  Occupational History  . Not on file  Tobacco Use  . Smoking status: Former Smoker    Quit date: 08/26/2006    Years since quitting: 14.0  . Smokeless tobacco: Never Used  Vaping Use  . Vaping Use: Never used  Substance and Sexual Activity  . Alcohol use: No    Alcohol/week: 0.0 standard drinks  . Drug use: No  . Sexual activity: Not on file  Other Topics Concern  . Not on file  Social History Narrative   Drinks 2 cups of coffee a day    Social Determinants of Health   Financial Resource Strain: Not on file  Food Insecurity: Not on file  Transportation Needs: Not on file  Physical Activity: Not on file  Stress: Not on file  Social Connections: Not on file  Intimate Partner Violence: Not on file    Review of Systems:    Constitutional: No weight loss, fever or chills Cardiovascular: No chest pain Respiratory: No SOB  Gastrointestinal: See HPI and otherwise negative   Physical Exam:  Vital signs: BP 122/70   Pulse 88   Ht 5\' 6"  (1.676 m)   Wt 247 lb 6.4 oz (112.2 kg)   BMI 39.93 kg/m   Constitutional:   Pleasant obese Caucasian female appears to be in NAD, Well developed, Well nourished, alert and cooperative Respiratory: Respirations even and unlabored. Lungs clear to auscultation bilaterally.   No wheezes, crackles, or rhonchi.  Cardiovascular: Normal S1, S2. No MRG. Regular rate and rhythm. No peripheral edema, cyanosis or pallor.  Gastrointestinal:  Soft, nondistended, nontender. No rebound or guarding. Normal bowel sounds. No appreciable masses or hepatomegaly. Rectal:  Not performed.  Psychiatric:  Demonstrates good judgement and reason without abnormal affect or behaviors.  No recent labs or imaging.  Assessment: 1.  Gastroparesis: Diagnosed with gastroparesis by a delayed gastric emptying study within the past year, patient  has not been taking her medication correctly and has been continuing with symptoms 2.  GERD: Uncontrolled currently, likely due to above 3.  Screening for colorectal cancer: Patient is 77 and never had screening for colon cancer  Plan: 1.  Scheduled patient for her screening colonoscopy in the [facility] with Dr. Henrene Pastor.  Did provide the patient with a detailed list of risks for the procedure and she agrees to proceed. 2.  Refilled  patient's Metoclopramide 10 mg 4 times daily, 20 to 30 minutes before meals and at bedtime with a 90-day refill good for a year 3.  Refilled Pantoprazole 40 mg twice daily, 30-60 minutes before breakfast and dinner with a 90-day refill good for a year 4.  Refilled Famotidine 20 mg nightly #90 with 3 refills 5.  Had a long discussion with the patient about why she is taking Metoclopramide and what gastroparesis is.  Explained that taking too much of this medication at one time can increase her risk of side effects and does not allow it to work as it is supposed to.  Encouraged her to take this medicine 4 times a day as directed.  Explained that if she feels like she is getting anxious again as she discussed with Dr. Henrene Pastor previously then could try twice daily, but would still need to spread these doses out and ideally before meals.  She verbalized understanding and will try to take her medications correctly because right now she just "does not feel good". 6.  Patient to follow in clinic per recommendations from Dr. Henrene Pastor after time of procedure.  Ellouise Newer, PA-C [facility] 08/27/2020, 3:01 PM  Cc: Burnard Bunting, MD

## 2020-08-27 NOTE — Patient Instructions (Addendum)
If you are age 51 or older, your body mass index should be between 23-30. Your Body mass index is 39.93 kg/m. If this is out of the aforementioned range listed, please consider follow up with your Primary Care Provider.  If you are age 51 or younger, your body mass index should be between 19-25. Your Body mass index is 39.93 kg/m. If this is out of the aformentioned range listed, please consider follow up with your Primary Care Provider.   We have sent the following medications to your pharmacy for you to pick up at your convenience: Pantoprazole 40 mg , Famotidine 20 mg , Metoclopramide 10 mg .   You have been scheduled for a colonoscopy. Please follow written instructions given to you at your visit today.  Please pick up your prep supplies at the pharmacy within the next 1-3 days. If you use inhalers (even only as needed), please bring them with you on the day of your procedure.  _  _   ORAL DIABETIC MEDICATION INSTRUCTIONS  The day before your procedure:  Take your diabetic pill as you do normally  The day of your procedure:  Do not take your diabetic pill   We will check your blood sugar levels during the admission process and again in Recovery before discharging you home  ______________________________________________________________________  _  _   INSULIN (LONG ACTING) MEDICATION INSTRUCTIONS (Lantus, NPH, 70/30, Humulin, Novolin-N, Levemir, Toujeo, Tresiba )   The day before your procedure:  Take  your regular evening dose    The day of your procedure:  Do not take your morning dose  _  _   INSULIN PUMP MEDICATION INSTRUCTIONS We will contact the physician managing your diabetic care for written dosage instructions for the day before your procedure and the day of your procedure.  Once we have received the instructions, we will contact you.    Thank you for choosing me and [facility].  Ellouise Newer, PA-C

## 2020-09-08 ENCOUNTER — Telehealth: Payer: Self-pay

## 2020-09-08 NOTE — Telephone Encounter (Signed)
Called Dr.Aronsons office regarding the insulin pump letter that I faxed over 2 weeks ago was transferred to his medical assistants voice mail and left VM stating what the letter was about and faxed another copy to their office.

## 2020-09-11 NOTE — Telephone Encounter (Signed)
Called Dr.Aronsons office regarding insulin pump letter for a third time.I was informed that his nurse has three doctors and is overwhelmed so all I can do is wait for a call back.

## 2020-09-16 NOTE — Telephone Encounter (Signed)
Left Voicmail for patient stating I would cancel appointment for this week but to call back and reschedule for 2 weeks later.

## 2020-09-18 ENCOUNTER — Encounter: Payer: 59 | Admitting: Internal Medicine

## 2020-10-13 ENCOUNTER — Emergency Department (HOSPITAL_COMMUNITY)
Admission: EM | Admit: 2020-10-13 | Discharge: 2020-10-14 | Disposition: A | Discharge status: Home / Self Care | Payer: 59 | Attending: Emergency Medicine | Admitting: Emergency Medicine

## 2020-10-13 ENCOUNTER — Other Ambulatory Visit: Payer: Self-pay

## 2020-10-13 ENCOUNTER — Ambulatory Visit (HOSPITAL_COMMUNITY): Admission: RE | Admit: 2020-10-13 | Payer: 59 | Source: Ambulatory Visit

## 2020-10-13 ENCOUNTER — Other Ambulatory Visit: Payer: Self-pay | Admitting: Registered Nurse

## 2020-10-13 ENCOUNTER — Encounter (HOSPITAL_COMMUNITY): Payer: Self-pay

## 2020-10-13 ENCOUNTER — Other Ambulatory Visit (HOSPITAL_COMMUNITY): Payer: Self-pay | Admitting: Registered Nurse

## 2020-10-13 DIAGNOSIS — R112 Nausea with vomiting, unspecified: Secondary | ICD-10-CM | POA: Insufficient documentation

## 2020-10-13 DIAGNOSIS — I10 Essential (primary) hypertension: Secondary | ICD-10-CM | POA: Insufficient documentation

## 2020-10-13 DIAGNOSIS — Z87891 Personal history of nicotine dependence: Secondary | ICD-10-CM | POA: Diagnosis not present

## 2020-10-13 DIAGNOSIS — R6883 Chills (without fever): Secondary | ICD-10-CM | POA: Insufficient documentation

## 2020-10-13 DIAGNOSIS — R1032 Left lower quadrant pain: Secondary | ICD-10-CM

## 2020-10-13 DIAGNOSIS — R1031 Right lower quadrant pain: Secondary | ICD-10-CM

## 2020-10-13 DIAGNOSIS — Z7984 Long term (current) use of oral hypoglycemic drugs: Secondary | ICD-10-CM | POA: Insufficient documentation

## 2020-10-13 DIAGNOSIS — R1030 Lower abdominal pain, unspecified: Secondary | ICD-10-CM | POA: Diagnosis present

## 2020-10-13 DIAGNOSIS — R197 Diarrhea, unspecified: Secondary | ICD-10-CM | POA: Insufficient documentation

## 2020-10-13 DIAGNOSIS — R1084 Generalized abdominal pain: Secondary | ICD-10-CM

## 2020-10-13 DIAGNOSIS — Z79899 Other long term (current) drug therapy: Secondary | ICD-10-CM | POA: Diagnosis not present

## 2020-10-13 DIAGNOSIS — Z7982 Long term (current) use of aspirin: Secondary | ICD-10-CM | POA: Insufficient documentation

## 2020-10-13 DIAGNOSIS — E119 Type 2 diabetes mellitus without complications: Secondary | ICD-10-CM | POA: Insufficient documentation

## 2020-10-13 DIAGNOSIS — K219 Gastro-esophageal reflux disease without esophagitis: Secondary | ICD-10-CM | POA: Diagnosis not present

## 2020-10-13 DIAGNOSIS — A084 Viral intestinal infection, unspecified: Secondary | ICD-10-CM

## 2020-10-13 LAB — COMPREHENSIVE METABOLIC PANEL
ALT: 17 U/L (ref 0–44)
AST: 13 U/L — ABNORMAL LOW (ref 15–41)
Albumin: 4 g/dL (ref 3.5–5.0)
Alkaline Phosphatase: 88 U/L (ref 38–126)
Anion gap: 10 (ref 5–15)
BUN: 26 mg/dL — ABNORMAL HIGH (ref 6–20)
CO2: 28 mmol/L (ref 22–32)
Calcium: 10.1 mg/dL (ref 8.9–10.3)
Chloride: 101 mmol/L (ref 98–111)
Creatinine, Ser: 1.04 mg/dL — ABNORMAL HIGH (ref 0.44–1.00)
GFR, Estimated: >60 mL/min (ref >60)
Glucose, Bld: 182 mg/dL — ABNORMAL HIGH (ref 70–99)
Potassium: 3.7 mmol/L (ref 3.5–5.1)
Sodium: 139 mmol/L (ref 135–145)
Total Bilirubin: 0.7 mg/dL (ref 0.3–1.2)
Total Protein: 8 g/dL (ref 6.5–8.1)

## 2020-10-13 LAB — URINALYSIS, ROUTINE W REFLEX MICROSCOPIC
Bacteria, UA: NONE SEEN
Bilirubin Urine: NEGATIVE
Glucose, UA: >=500 mg/dL — AB
Hgb urine dipstick: NEGATIVE
Ketones, ur: 5 mg/dL — AB
Leukocytes,Ua: NEGATIVE
Nitrite: NEGATIVE
Protein, ur: 30 mg/dL — AB
Specific Gravity, Urine: 1.029 (ref 1.005–1.030)
pH: 5 (ref 5.0–8.0)

## 2020-10-13 LAB — CBC
HCT: 43.2 % (ref 36.0–46.0)
Hemoglobin: 13.9 g/dL (ref 12.0–15.0)
MCH: 28 pg (ref 26.0–34.0)
MCHC: 32.2 g/dL (ref 30.0–36.0)
MCV: 86.9 fL (ref 80.0–100.0)
Platelets: 221 10*3/uL (ref 150–400)
RBC: 4.97 MIL/uL (ref 3.87–5.11)
RDW: 14.7 % (ref 11.5–15.5)
WBC: 8.8 10*3/uL (ref 4.0–10.5)
nRBC: 0 % (ref 0.0–0.2)

## 2020-10-13 LAB — LIPASE, BLOOD: Lipase: 28 U/L (ref 11–51)

## 2020-10-13 LAB — I-STAT BETA HCG BLOOD, ED (MC, WL, AP ONLY): I-stat hCG, quantitative: <5 m[IU]/mL (ref <5)

## 2020-10-13 MED ORDER — MORPHINE SULFATE (PF) 4 MG/ML IV SOLN
4.0000 mg | Freq: Once | INTRAVENOUS | Status: AC
Start: 2020-10-13 — End: 2020-10-13
  Administered 2020-10-13: 4 mg via INTRAVENOUS
  Filled 2020-10-13: qty 1

## 2020-10-13 MED ORDER — SODIUM CHLORIDE 0.9 % IV BOLUS
1000.0000 mL | Freq: Once | INTRAVENOUS | Status: AC
  Administered 2020-10-13: 1000 mL via INTRAVENOUS

## 2020-10-13 MED ORDER — METOCLOPRAMIDE HCL 5 MG/ML IJ SOLN
10.0000 mg | Freq: Once | INTRAMUSCULAR | Status: AC
  Administered 2020-10-13: 10 mg via INTRAVENOUS
  Filled 2020-10-13: qty 2

## 2020-10-13 NOTE — ED Provider Notes (Signed)
[facility] DEPT Provider Note   CSN: [id number] Arrival date & time: 10/13/20  1748     History Chief Complaint  Patient presents with  . Emesis    Catherine Hebert is a 50 y.o. female with a history of IDDM with gastroparesis, kidney stones, hiatal hernia, GERD, HLD, HTN who presents the emergency department by EMS from her PCPs office with a chief complaint of abdominal pain.  The patient endorses 2 days of intermittent bilateral lower abdominal pain.  She is unable to characterize the pain.  Pain does seem to be slightly improved with vomiting, but no other aggravating or alleviating factors.  Current pain is 10/03/08, but will be a 10 out of 10 during some episodes.  She reports associated chills, nausea, vomiting, and diarrhea for the last 2 days.  She has had approximately 5-6 episodes of nonbloody, nonbilious vomiting today and has been unable to tolerate any food or fluids.  She attempted to take 3 doses of Zofran from a previous prescription, but continued to have vomiting.  She also endorses multiple episodes of loose, nonbloody diarrhea.   She denies fever, chest pain, shortness of breath, dysuria, hematuria, flank pain, vaginal bleeding or discharge, numbness, weakness, dizziness.  She has a history of gastroparesis and is followed by Dr. Henrene Pastor with GI, but states that current symptoms feel different.  She was previously told many years ago that she may have diverticulosis.  She has a remote history of a kidney stone for more than 20 years ago, but states that her current symptoms feel different.  Surgical history includes cesarean section x2.  No other treatment prior to arrival.  No recent travel.  No known sick contacts.   The history is provided by the patient and medical records. No language interpreter was used.       Past Medical History:  Diagnosis Date  . Allergic rhinitis   . Bursitis of elbow   . Diabetes mellitus without complication  ([place]) 5361  . Diverticulosis   . Esophageal reflux disease   . Esophageal stricture   . Fatigue   . GERD (gastroesophageal reflux disease)   . Hiatal hernia   . Hypertension   . Kidney stones   . Ovarian cyst   . Sleep apnea   . Tachycardia 2017   ventricular tachycardia     Patient Active Problem List   Diagnosis Date Noted  . Elevated troponin   . Atrial tachycardia ([place]) 10/31/2015  . IDDM (insulin dependent diabetes mellitus) 10/31/2015  . Hypertension 10/31/2015  . Hyperlipemia 10/31/2015  . Obesity 10/31/2015  . GERD (gastroesophageal reflux disease) 10/31/2015  . Depression 10/31/2015  . Leukocytosis 10/31/2015  . Shingles 10/31/2015  . Cellulitis and abscess of trunk-right flank 08/25/2012    Past Surgical History:  Procedure Laterality Date  . CESAREAN SECTION  1998  . CESAREAN SECTION  2003  . i&d back abscess  08/25/12  . kidney stone removal  1993  . UPPER GASTROINTESTINAL ENDOSCOPY       OB History   No obstetric history on file.     Family History  Problem Relation Age of Onset  . Diabetes Mother   . Heart attack Mother 34  . Diabetes Father   . Sleep apnea Father   . Heart attack Father 101  . Heart attack Cousin   . Colon cancer Neg Hx   . Stomach cancer Neg Hx   . Rectal cancer Neg Hx   . Esophageal  cancer Neg Hx   . Colon polyps Neg Hx     Social History   Tobacco Use  . Smoking status: Former Smoker    Quit date: 08/26/2006    Years since quitting: 14.1  . Smokeless tobacco: Never Used  Vaping Use  . Vaping Use: Never used  Substance Use Topics  . Alcohol use: No    Alcohol/week: 0.0 standard drinks  . Drug use: No    Home Medications Prior to Admission medications   Medication Sig Start Date End Date Taking? Authorizing Provider  aspirin 81 MG tablet Take 81 mg by mouth daily.    [provider]  buPROPion (WELLBUTRIN XL) 150 MG 24 hr tablet Take 150 mg by mouth every morning. 10/31/19   [provider]   Continuous Blood Gluc Sensor (FREESTYLE LIBRE 14 DAY SENSOR) MISC Apply 1 Device topically daily. 09/17/18   [provider]  famotidine (PEPCID) 20 MG tablet TAKE 1 TABLET BY MOUTH AT BEDTIME 08/27/20   Levin Erp, PA  FLUoxetine (PROZAC) 40 MG capsule Take 40 mg by mouth daily.  08/10/12   [provider]  JARDIANCE 10 MG TABS tablet TK 1 T PO QD 07/07/15   [provider]  LANTUS SOLOSTAR 100 UNIT/ML Solostar Pen daily. 150 units per day 08/10/19   [provider]  losartan-hydrochlorothiazide (HYZAAR) 50-12.5 MG tablet Take 1 tablet by mouth daily. 02/23/16   [provider]  metFORMIN (GLUCOPHAGE) 500 MG tablet Take 1,000 mg by mouth daily.     [provider]  metoCLOPramide (REGLAN) 10 MG tablet TAKE 1 TABLET(10 MG) BY MOUTH FOUR TIMES DAILY BEFORE MEALS AND AT BEDTIME 08/27/20   Levin Erp, PA  metoprolol succinate (TOPROL-XL) 25 MG 24 hr tablet Take 1 tablet (25 mg total) by mouth daily. Please make yearly appt with Dr. Lovena Le for June 2022 for future refills. Thank you 1st attempt 08/12/20   Evans Lance, MD  ondansetron [facility]) 4 MG tablet as needed. 08/11/18   [provider]  pantoprazole (PROTONIX) 40 MG tablet Take 1 tablet (40 mg total) by mouth 2 (two) times daily. 08/27/20   Levin Erp, PA    Allergies    Lisinopril  Review of Systems   Review of Systems  Constitutional: Positive for chills. Negative for activity change, diaphoresis and fever.  HENT: Negative for congestion and sore throat.   Eyes: Negative for visual disturbance.  Respiratory: Negative for cough, shortness of breath and wheezing.   Cardiovascular: Negative for chest pain and palpitations.  Gastrointestinal: Positive for abdominal pain, diarrhea, nausea and vomiting.  Genitourinary: Negative for dysuria, flank pain, frequency, hematuria, pelvic pain, urgency, vaginal bleeding, vaginal discharge and vaginal pain.   Musculoskeletal: Positive for back pain. Negative for gait problem, myalgias, neck pain and neck stiffness.  Skin: Negative for rash and wound.  Allergic/Immunologic: Negative for immunocompromised state.  Neurological: Negative for dizziness, seizures, syncope, weakness, numbness and headaches.  Psychiatric/Behavioral: Negative for confusion.    Physical Exam Updated Vital Signs BP (!) 144/71 (BP Location: Left Arm)   Pulse 99   Temp 99.3 F (37.4 C) (Oral)   Resp 16   Ht 5\' 5"  (1.651 m)   Wt 108.9 kg   SpO2 98%   BMI 39.94 kg/m   Physical Exam Vitals and nursing note reviewed.  Constitutional:      General: She is not in acute distress.    Appearance: She is not ill-appearing, toxic-appearing or diaphoretic.  Comments: Uncomfortable appearing  HENT:     Head: Normocephalic.  Eyes:     Conjunctiva/sclera: Conjunctivae normal.  Cardiovascular:     Rate and Rhythm: Normal rate and regular rhythm.     Heart sounds: No murmur heard. No friction rub. No gallop.   Pulmonary:     Effort: Pulmonary effort is normal. No respiratory distress.     Breath sounds: No stridor. No wheezing, rhonchi or rales.  Chest:     Chest wall: No tenderness.  Abdominal:     General: There is distension.     Palpations: Abdomen is soft. There is no mass.     Tenderness: There is abdominal tenderness. There is guarding and rebound. There is no right CVA tenderness or left CVA tenderness.     Hernia: No hernia is present.     Comments: Abdomen is obese, but mildly distended and soft.  Hypoactive bowel sounds in all 4 quadrants.  She is diffusely tender to palpation, but maximal tenderness is in the right upper quadrant and left lower quadrant.  She has rebound and guarding with palpation in the left lower quadrant.  No CVA tenderness bilaterally.  Negative Murphy sign.  No tenderness over McBurney's point.  Musculoskeletal:        General: No tenderness.     Cervical back: Neck supple.      Right lower leg: No edema.     Left lower leg: No edema.  Skin:    General: Skin is warm.     Findings: No rash.  Neurological:     Mental Status: She is alert.  Psychiatric:        Behavior: Behavior normal.     ED Results / Procedures / Treatments   Labs (all labs ordered are listed, but only abnormal results are displayed) Labs Reviewed  COMPREHENSIVE METABOLIC PANEL - Abnormal; Notable for the following components:      Result Value   Glucose, Bld 182 (*)    BUN 26 (*)    Creatinine, Ser 1.04 (*)    AST 13 (*)    All other components within normal limits  URINALYSIS, ROUTINE W REFLEX MICROSCOPIC - Abnormal; Notable for the following components:   Glucose, UA >=500 (*)    Ketones, ur 5 (*)    Protein, ur 30 (*)    All other components within normal limits  LIPASE, BLOOD  CBC  I-STAT BETA HCG BLOOD, ED ([facility], [facility], [facility] ONLY)    EKG None  Radiology No results found.  Procedures Procedures   Medications Ordered in ED Medications  sodium chloride 0.9 % bolus 1,000 mL (has no administration in time range)  metoCLOPramide (REGLAN) injection 10 mg (has no administration in time range)  morphine 4 MG/ML injection 4 mg (has no administration in time range)    ED Course  I have reviewed the triage vital signs and the nursing notes.  Pertinent labs & imaging results that were available during my care of the patient were reviewed by me and considered in my medical decision making (see chart for details).    MDM Rules/Calculators/A&P                          50 year old female with a history of IDDM with gastroparesis, kidney stones, hiatal hernia, GERD, HLD, HTN who presents the emergency department with a 2-day history of intermittent abdominal pain, with associated nausea, vomiting, diarrhea, and chills.   She is hypertensive  in the emergency department, but vital signs are otherwise stable.  On exam, she has diffusely tender to palpation, but maximal tenderness is in  the left lower quadrant and right upper quadrant.  I am most suspicious for diverticulitis based on her exam, but I have also consider the following causes of her symptoms including bowel obstruction, mesenteric ischemia, cholecystitis, appendicitis, pyelonephritis, PID, ovarian torsion, pancreatitis, or gastroenteritis.   Labs and imaging have been reviewed and independently interpreted by me.  She has hyperglycemia, but no evidence of DKA or HHS.  Transaminases are unremarkable.  Lipase is normal.  Urinalysis with no evidence of infection.  Will order CT abdomen pelvis without contrast due to national iodinated contrast shortage.  Will give Reglan, morphine, and fluids for symptomatic management.  CT abdomen pelvis is unremarkable.  I have a lower suspicion for diverticulitis given that patient does not have a leukocytosis and CT is unremarkable.  I am most suspicious for viral gastroenteritis versus a flare of gastroparesis.  Doubt DKA or HHS.  I have discussed all lab results with the patient and answered all questions.  She was successfully fluid challenged in the emergency department and feels much improved.  At this time will discharge home with supportive care for likely viral gastroenterology.  She can follow-up with GI in the outpatient setting as needed.  She is hemodynamically stable and in no acute distress.  ER return precautions discussed.  Safe for discharge to home with outpatient follow-up as needed.  Final Clinical Impression(s) / ED Diagnoses Final diagnoses:  None    Rx / DC Orders ED Discharge Orders    None       Joanne Gavel, PA-C 10/14/20 0358    Mesner, Corene Cornea, MD 10/14/20 [phone]

## 2020-10-13 NOTE — ED Triage Notes (Signed)
Pt c/o abd pain w n/v/d x 3 days. Tried zofran at home w/o relief.

## 2020-10-14 ENCOUNTER — Emergency Department (HOSPITAL_COMMUNITY): Payer: 59

## 2020-10-14 ENCOUNTER — Other Ambulatory Visit: Payer: Self-pay | Admitting: Internal Medicine

## 2020-10-14 MED ORDER — METOCLOPRAMIDE HCL 10 MG PO TABS: 10.0000 mg | ORAL_TABLET | Freq: Four times a day (QID) | ORAL | 0 refills | Status: AC

## 2020-10-14 NOTE — Discharge Instructions (Addendum)
Thank you for allowing me to care for you today in the Emergency Department.   You were seen today for abdominal pain, nausea, vomiting, and diarrhea.  Your symptoms are most consistent with a viral gastroenteritis or stomach bug.  This could also be a flareup of gastroparesis, but this is less likely given your symptoms.  Your work-up in the emergency department was consistent with mild dehydration, but was otherwise reassuring for you to be able to go home.  Take 1 tablet of Reglan every 6 hours as needed for nausea or vomiting.  Imodium is available over-the-counter and can be used as directed for diarrhea.  You can take 650 mg of Tylenol once every 6 hours for pain.  Follow-up with gastroenterology as needed.  Return to the emergency department if you have uncontrollable vomiting despite taking Reglan, if you stop making urine, if you pass out, if you start having fever (temperature greater than 100.4 F) with severe abdominal pain, or other new, concerning symptoms.

## 2020-11-04 ENCOUNTER — Ambulatory Visit (INDEPENDENT_AMBULATORY_CARE_PROVIDER_SITE_OTHER): Payer: 59 | Admitting: Behavioral Health

## 2020-11-04 ENCOUNTER — Other Ambulatory Visit: Payer: Self-pay

## 2020-11-04 ENCOUNTER — Encounter: Payer: Self-pay | Admitting: Behavioral Health

## 2020-11-04 VITALS — BP 132/77 | HR 85 | Ht 65.0 in | Wt 250.0 lb

## 2020-11-04 DIAGNOSIS — F331 Major depressive disorder, recurrent, moderate: Secondary | ICD-10-CM | POA: Diagnosis not present

## 2020-11-04 DIAGNOSIS — Z638 Other specified problems related to primary support group: Secondary | ICD-10-CM | POA: Diagnosis not present

## 2020-11-04 DIAGNOSIS — F411 Generalized anxiety disorder: Secondary | ICD-10-CM | POA: Diagnosis not present

## 2020-11-04 MED ORDER — BUPROPION HCL ER (XL) 300 MG PO TB24: 300.0000 mg | ORAL_TABLET | Freq: Every day | ORAL | 1 refills | Status: AC

## 2020-11-04 MED ORDER — FLUOXETINE HCL 20 MG PO CAPS: 60.0000 mg | ORAL_CAPSULE | Freq: Every day | ORAL | 1 refills | Status: DC

## 2020-11-04 NOTE — Progress Notes (Signed)
[facility] MD/PA/NP Initial Note  11/04/2020 2:39 PM Catherine Hebert  MRN:  [id number]  Chief Complaint:  Chief Complaint    Anxiety; Depression; Medication Problem; Medication Refill; Establish Care      HPI:  50 year old female presents to this office for initial visit and to establish care. She tearfully states that, "if I had my way, I was just lay in bed and not get out". She said that she spoke with a Education officer, museum approximately two years ago when she first realized she had symptoms of depression. Says that her PCP prescribed her Wellbutrin 150 mg XR with her Prozac 40 mg daily. She said that it really has not helped much. She said that she has been married for about 30 years to a man 10 years her senior. She says that he was diagnosed with Parkinson's about 12 years ago. She said that she has always been the bread winner and her husband the homemaker for many years, and now she is having to take care of him. She says that he pressure her for intimacy, but she does not have any desire for that. She says she cannot change gears, and sees him differently now that she is extensively caring for him (changing briefs etc). She says that between work, and caring for her husband she does not have a life right now, or time for herself. Says this ways on her heavily and cannot find energy to do much else. She says that she goes to bed around 8 pm but is sleeping to 7 am, getting about 11 hours of sleep per night. She reports her anxiety today at 6 and depression 7. She states that she would like to make changes to her medications to see if it will help alleviate some to the symptoms. She denies mania, no psychosis. Reports no SI/HI.  No prior psychiatric medication failures.  Visit Diagnosis:    ICD-10-CM   1. Major depressive disorder, recurrent episode, moderate (HCC)  F33.1 buPROPion (WELLBUTRIN XL) 300 MG 24 hr tablet    FLUoxetine (PROZAC) 20 MG capsule  2. Generalized anxiety disorder  F41.1  buPROPion (WELLBUTRIN XL) 300 MG 24 hr tablet    FLUoxetine (PROZAC) 20 MG capsule  3. Caregiver role strain  Z63.8 buPROPion (WELLBUTRIN XL) 300 MG 24 hr tablet    FLUoxetine (PROZAC) 20 MG capsule    Past Psychiatric History: PCP  Past Medical History:  Past Medical History:  Diagnosis Date  . Allergic rhinitis   . Bursitis of elbow   . Diabetes mellitus without complication ([place]) 7673  . Diverticulosis   . Esophageal reflux disease   . Esophageal stricture   . Fatigue   . GERD (gastroesophageal reflux disease)   . Hiatal hernia   . Hypertension   . Kidney stones   . Ovarian cyst   . Sleep apnea   . Tachycardia 2017   ventricular tachycardia     Past Surgical History:  Procedure Laterality Date  . CESAREAN SECTION  1998  . CESAREAN SECTION  2003  . i&d back abscess  08/25/12  . kidney stone removal  1993  . UPPER GASTROINTESTINAL ENDOSCOPY      Family Psychiatric History: none  Family History:  Family History  Problem Relation Age of Onset  . Diabetes Mother   . Heart attack Mother 46  . Diabetes Father   . Sleep apnea Father   . Heart attack Father 62  . Heart attack Cousin   . Colon  cancer Neg Hx   . Stomach cancer Neg Hx   . Rectal cancer Neg Hx   . Esophageal cancer Neg Hx   . Colon polyps Neg Hx     Social History:  Social History   Socioeconomic History  . Marital status: Married    Spouse name: Not on file  . Number of children: 2  . Years of education: College  . Highest education level: Not on file  Occupational History  . Not on file  Tobacco Use  . Smoking status: Former Smoker    Quit date: 08/26/2006    Years since quitting: 14.2  . Smokeless tobacco: Never Used  Vaping Use  . Vaping Use: Never used  Substance and Sexual Activity  . Alcohol use: Not Currently    Alcohol/week: 0.0 standard drinks  . Drug use: No  . Sexual activity: Not Currently  Other Topics Concern  . Not on file  Social History Narrative   Lives with  husband in [place].  Husband is 63 years older. He has Parkinson's. She likes to read but does not have time. Says would like more time for herself.   Social Determinants of Health   Financial Resource Strain: Not on file  Food Insecurity: Not on file  Transportation Needs: Not on file  Physical Activity: Not on file  Stress: Not on file  Social Connections: Not on file    Allergies:  Allergies  Allergen Reactions  . Lisinopril     cough    Metabolic Disorder Labs: Lab Results  Component Value Date   HGBA1C 8.7 (H) 10/31/2015   MPG 203 10/31/2015   No results found for: PROLACTIN Lab Results  Component Value Date   CHOL 161 11/01/2015   TRIG 274 (H) 11/01/2015   HDL 32 (L) 11/01/2015   CHOLHDL 5.0 11/01/2015   VLDL 55 (H) 11/01/2015   LDLCALC 74 11/01/2015   Lab Results  Component Value Date   TSH 2.099 10/31/2015    Therapeutic Level Labs: No results found for: LITHIUM No results found for: VALPROATE No components found for:  CBMZ  Current Medications: Current Outpatient Medications  Medication Sig Dispense Refill  . aspirin 81 MG tablet Take 81 mg by mouth daily.    Marland Kitchen buPROPion (WELLBUTRIN XL) 300 MG 24 hr tablet Take 1 tablet (300 mg total) by mouth daily. 30 tablet 1  . Continuous Blood Gluc Sensor (FREESTYLE LIBRE 14 DAY SENSOR) MISC Apply 1 Device topically daily.    . famotidine (PEPCID) 20 MG tablet TAKE 1 TABLET BY MOUTH AT BEDTIME 90 tablet 3  . FLUoxetine (PROZAC) 20 MG capsule Take 3 capsules (60 mg total) by mouth daily. 90 capsule 1  . JARDIANCE 10 MG TABS tablet TK 1 T PO QD  6  . LANTUS SOLOSTAR 100 UNIT/ML Solostar Pen daily. 150 units per day    . losartan-hydrochlorothiazide (HYZAAR) 50-12.5 MG tablet Take 1 tablet by mouth daily.    . metFORMIN (GLUCOPHAGE) 500 MG tablet Take 1,000 mg by mouth daily.     . metoCLOPramide (REGLAN) 10 MG tablet Take 1 tablet (10 mg total) by mouth every 6 (six) hours. 30 tablet 0  . metoprolol  succinate (TOPROL-XL) 25 MG 24 hr tablet TAKE 1 TABLET BY MOUTH  DAILY. PLEASE MAKE YEARLY  APPT WITH DR. Lovena Le FOR  JUNE 2022 FOR FUTURE  REFILLS. THANK YOU 1ST  ATTEMPT 30 tablet 0  . ondansetron (ZOFRAN) 4 MG tablet as needed.    Marland Kitchen  pantoprazole (PROTONIX) 40 MG tablet Take 1 tablet (40 mg total) by mouth 2 (two) times daily. 180 tablet 3  . diltiazem (CARDIZEM) 60 MG tablet     . insulin lispro (HUMALOG) 100 UNIT/ML injection      No current facility-administered medications for this visit.    Medication Side Effects: none  Orders placed this visit:  No orders of the defined types were placed in this encounter.   Psychiatric Specialty Exam:  Review of Systems  Constitutional: Negative.   Cardiovascular: Negative for palpitations.  Allergic/Immunologic: Negative.   Neurological: Negative for tremors and weakness.  Psychiatric/Behavioral: Positive for dysphoric mood. The patient is nervous/anxious.     Blood pressure 132/77, pulse 85, height 5\' 5"  (1.651 m), weight 250 lb (113.4 kg).Body mass index is 41.6 kg/m.  General Appearance: Casual, Neat and Well Groomed  Eye Contact:  Good  Speech:  Clear and Coherent  Volume:  Normal  Mood:  Anxious, Depressed and Dysphoric  Affect:  Depressed and Tearful  Thought Process:  Coherent  Orientation:  Full (Time, Place, and Person)  Thought Content: Logical   Suicidal Thoughts:  No  Homicidal Thoughts:  No  Memory:  WNL  Judgement:  Good  Insight:  Good  Psychomotor Activity:  Normal  Concentration:  Concentration: Good  Recall:  Good  Fund of Knowledge: Good  Language: Good  Assets:  Desire for Improvement  ADL's:  Intact  Cognition: WNL  Prognosis:  Good   Screenings:  Flowsheet Row ED from 10/13/2020 in [place] DEPT  C-SSRS RISK CATEGORY No Risk      Receiving Psychotherapy: No   Treatment Plan/Recommendations:  To increase Wellbutrin to 300 mg XL daily To increase Prozac to 60 mg  daily Will report any side effects or worsening symptoms promptly To follow up in 4 weeks to reassess. Greater than 50% of 60 min face to face time with patient was spent on counseling and coordination of care. We discussed history of depressive symptoms in detail. Provided light counseling on healthy coping mechanisms, appropriate nutrition, exercise, and light therapy. We also discussed care giver role strain and making time for self-needs. Recommended psychotherapy. E scribed changes to prozac and wellbutrin to pharmacy.     [name], NP

## 2020-11-26 ENCOUNTER — Ambulatory Visit (AMBULATORY_SURGERY_CENTER): Payer: 59 | Admitting: Internal Medicine

## 2020-11-26 ENCOUNTER — Other Ambulatory Visit: Payer: Self-pay

## 2020-11-26 ENCOUNTER — Encounter: Payer: Self-pay | Admitting: Internal Medicine

## 2020-11-26 VITALS — BP 145/79 | HR 81 | Temp 98.0°F | Resp 13 | Ht 66.0 in | Wt 247.0 lb

## 2020-11-26 DIAGNOSIS — Z1211 Encounter for screening for malignant neoplasm of colon: Secondary | ICD-10-CM | POA: Diagnosis not present

## 2020-11-26 DIAGNOSIS — D125 Benign neoplasm of sigmoid colon: Secondary | ICD-10-CM

## 2020-11-26 DIAGNOSIS — D124 Benign neoplasm of descending colon: Secondary | ICD-10-CM

## 2020-11-26 MED ORDER — SODIUM CHLORIDE 0.9 % IV SOLN: 500.0000 mL | Freq: Once | INTRAVENOUS | Status: DC

## 2020-11-26 NOTE — Progress Notes (Signed)
N.C vital signs. 

## 2020-11-26 NOTE — Progress Notes (Signed)
A/ox3, pleased with MAC, report to RN 

## 2020-11-26 NOTE — Progress Notes (Signed)
Pt's states no medical or surgical changes since previsit or office visit. 

## 2020-11-26 NOTE — Patient Instructions (Signed)
YOU HAD AN ENDOSCOPIC PROCEDURE TODAY AT THE [facility]:   Refer to the procedure report that was given to you for any specific questions about what was found during the examination.  If the procedure report does not answer your questions, please call your gastroenterologist to clarify.  If you requested that your care partner not be given the details of your procedure findings, then the procedure report has been included in a sealed envelope for you to review at your convenience later.  YOU SHOULD EXPECT: Some feelings of bloating in the abdomen. Passage of more gas than usual.  Walking can help get rid of the air that was put into your GI tract during the procedure and reduce the bloating. If you had a lower endoscopy (such as a colonoscopy or flexible sigmoidoscopy) you may notice spotting of blood in your stool or on the toilet paper. If you underwent a bowel prep for your procedure, you may not have a normal bowel movement for a few days.  Please Note:  You might notice some irritation and congestion in your nose or some drainage.  This is from the oxygen used during your procedure.  There is no need for concern and it should clear up in a day or so.  SYMPTOMS TO REPORT IMMEDIATELY:   Following lower endoscopy (colonoscopy or flexible sigmoidoscopy):  Excessive amounts of blood in the stool  Significant tenderness or worsening of abdominal pains  Swelling of the abdomen that is new, acute  Fever of 100F or higher   Following upper endoscopy (EGD)  Vomiting of blood or coffee ground material  New chest pain or pain under the shoulder blades  Painful or persistently difficult swallowing  New shortness of breath  Fever of 100F or higher  Black, tarry-looking stools  For urgent or emergent issues, a gastroenterologist can be reached at any hour by calling [phone]. Do not use MyChart messaging for urgent concerns.    DIET:  We do recommend a small meal at first, but  then you may proceed to your regular diet.  Drink plenty of fluids but you should avoid alcoholic beverages for 24 hours.  ACTIVITY:  You should plan to take it easy for the rest of today and you should NOT DRIVE or use heavy machinery until tomorrow (because of the sedation medicines used during the test).    FOLLOW UP: Our staff will call the number listed on your records 48-72 hours following your procedure to check on you and address any questions or concerns that you may have regarding the information given to you following your procedure. If we do not reach you, we will leave a message.  We will attempt to reach you two times.  During this call, we will ask if you have developed any symptoms of COVID 19. If you develop any symptoms (ie: fever, flu-like symptoms, shortness of breath, cough etc.) before then, please call [phone].  If you test positive for Covid 19 in the 2 weeks post procedure, please call and report this information to us.    If any biopsies were taken you will be contacted by phone or by letter within the next 1-3 weeks.  Please call us at [phone] if you have not heard about the biopsies in 3 weeks.    SIGNATURES/CONFIDENTIALITY: You and/or your care partner have signed paperwork which will be entered into your electronic medical record.  These signatures attest to the fact that that the information above on   your After Visit Summary has been reviewed and is understood.  Full responsibility of the confidentiality of this discharge information lies with you and/or your care-partner. 

## 2020-11-26 NOTE — Op Note (Signed)
[place] Patient Name: Catherine Hebert Procedure Date: 11/26/2020 2:56 PM MRN: [id number] Endoscopist: Docia Chuck. Henrene Pastor , MD Age: 50 Referring MD:  Date of Birth: 09/30/1970 Gender: Female Account #: [id number] Procedure:                Colonoscopy with cold snare polypectomy x 2 Indications:              Screening for colorectal malignant neoplasm Medicines:                Monitored Anesthesia Care Procedure:                Pre-Anesthesia Assessment:                           - Prior to the procedure, a History and Physical                            was performed, and patient medications and                            allergies were reviewed. The patient's tolerance of                            previous anesthesia was also reviewed. The risks                            and benefits of the procedure and the sedation                            options and risks were discussed with the patient.                            All questions were answered, and informed consent                            was obtained. Prior Anticoagulants: The patient has                            taken no previous anticoagulant or antiplatelet                            agents. ASA Grade Assessment: III - A patient with                            severe systemic disease. After reviewing the risks                            and benefits, the patient was deemed in                            satisfactory condition to undergo the procedure.                           After obtaining informed consent, the colonoscope  was passed under direct vision. Throughout the                            procedure, the patient's blood pressure, pulse, and                            oxygen saturations were monitored continuously. The                            CF [id number] #[id number] was introduced through the anus                            and advanced to the the cecum, identified by                             appendiceal orifice and ileocecal valve. The                            ileocecal valve, appendiceal orifice, and rectum                            were photographed. The quality of the bowel                            preparation was excellent. The colonoscopy was                            performed without difficulty. The patient tolerated                            the procedure well. The bowel preparation used was                            SUPREP via split dose instruction. Scope In: 3:05:13 PM Scope Out: 3:21:49 PM Scope Withdrawal Time: 0 hours 13 minutes 5 seconds  Total Procedure Duration: 0 hours 16 minutes 36 seconds  Findings:                 Two polyps were found in the sigmoid colon and                            descending colon. The polyps were 5 to 6 mm in                            size. These polyps were removed with a cold snare.                            Resection and retrieval were complete.                           A few small-mouthed diverticula were found                            scattered in the colon.  The exam was otherwise without abnormality on                            direct and retroflexion views. Complications:            No immediate complications. Estimated blood loss:                            None. Estimated Blood Loss:     Estimated blood loss: none. Impression:               - Two 5 to 6 mm polyps in the sigmoid colon and in                            the descending colon, removed with a cold snare.                            Resected and retrieved.                           - Diverticulosis.                           - The examination was otherwise normal on direct                            and retroflexion views. Recommendation:           - Repeat colonoscopy in 5-10 years for surveillance.                           - Patient has a contact number available for                             emergencies. The signs and symptoms of potential                            delayed complications were discussed with the                            patient. Return to normal activities tomorrow.                            Written discharge instructions were provided to the                            patient.                           - Resume previous diet.                           - Continue present medications.                           - Await pathology results. Docia Chuck. Henrene Pastor, MD 11/26/2020 3:28:30 PM This report has been signed electronically.

## 2020-11-26 NOTE — Progress Notes (Signed)
Called to room to assist during endoscopic procedure.  Patient ID and intended procedure confirmed with present staff. Received instructions for my participation in the procedure from the performing physician.  

## 2020-11-28 ENCOUNTER — Telehealth: Payer: Self-pay

## 2020-11-28 ENCOUNTER — Other Ambulatory Visit: Payer: Self-pay | Admitting: Internal Medicine

## 2020-11-28 ENCOUNTER — Telehealth: Payer: Self-pay | Admitting: *Deleted

## 2020-11-28 NOTE — Telephone Encounter (Signed)
Message left

## 2020-11-28 NOTE — Telephone Encounter (Deleted)
  Follow up Call-  Call back number 11/26/2020 10/11/2018  Post procedure Call Back phone  # [phone]  Permission to leave phone message Yes Yes  Some recent data might be hidden     Patient questions:  Do you have a fever, pain , or abdominal swelling? {yes no:314532} Pain Score  {NUMBERS; 0-10:5044} *  Have you tolerated food without any problems? {yes no:314532}  Have you been able to return to your normal activities? {yes no:314532}  Do you have any questions about your discharge instructions: Diet   {yes no:314532} Medications  {yes no:314532} Follow up visit  {yes no:314532}  Do you have questions or concerns about your Care? {yes no:314532}  Actions: * If pain score is 4 or above: {ACTION; [facility] ENDO PAIN >4:21563}

## 2020-11-28 NOTE — Telephone Encounter (Signed)
I dialed [phone] three times and the line has a beep, beep sound.  Unable to leave a message.  This was a follow up call from colonoscopy on 11/26/20 [facility]

## 2020-12-03 ENCOUNTER — Encounter: Payer: Self-pay | Admitting: Internal Medicine

## 2020-12-04 ENCOUNTER — Encounter: Payer: Self-pay | Admitting: Behavioral Health

## 2020-12-04 ENCOUNTER — Ambulatory Visit: Payer: 59 | Admitting: Behavioral Health

## 2020-12-04 ENCOUNTER — Other Ambulatory Visit: Payer: Self-pay

## 2020-12-04 DIAGNOSIS — F411 Generalized anxiety disorder: Secondary | ICD-10-CM | POA: Diagnosis not present

## 2020-12-04 DIAGNOSIS — Z638 Other specified problems related to primary support group: Secondary | ICD-10-CM | POA: Diagnosis not present

## 2020-12-04 DIAGNOSIS — F331 Major depressive disorder, recurrent, moderate: Secondary | ICD-10-CM | POA: Diagnosis not present

## 2020-12-04 MED ORDER — VILAZODONE HCL 20 MG PO TABS
20.0000 mg | ORAL_TABLET | Freq: Every day | ORAL | 0 refills | Status: DC
Start: 2020-12-04 — End: 2021-02-19

## 2020-12-04 NOTE — Progress Notes (Signed)
[facility] Med Check  Patient ID: Catherine Hebert,  MRN: [id number]  PCP: Burnard Bunting, MD  Date of Evaluation: 12/04/2020 Time spent:30 minutes  Chief Complaint:  Chief Complaint   Anxiety; Depression; Follow-up; Medication Problem; Medication Refill     HISTORY/CURRENT STATUS: HPI : 50 year old female presents to this office for follow up and medication management. She says that she feel like depression and anxiety have recently worsened. She says that the demand for her husbands mental and physical needs have increased and it has been very hard on her the last few weeks. She feels like the increase of Prozac to 60 mg and increase of Wellbutrin to 300 mg XL have made no difference. She reports anxiety today at 8/10 and depression at 7/10. She says that all she wants to do is sleep. Says that she feels like it is beginning to effect her job. She would like to make a change with her medications. No mania present. No psychosis. No SI/HI.   Prior psychiatric medication trials: Wellbutrin Prozac   Individual Medical History/ Review of Systems: Changes? :No   Allergies: Lisinopril  Current Medications:  Current Outpatient Medications:    Vilazodone HCl (VIIBRYD) 20 MG TABS, Take 1 tablet (20 mg total) by mouth daily after breakfast., Disp: 30 tablet, Rfl: 0   aspirin 81 MG tablet, Take 81 mg by mouth daily., Disp: , Rfl:    buPROPion (WELLBUTRIN XL) 300 MG 24 hr tablet, Take 1 tablet (300 mg total) by mouth daily., Disp: 30 tablet, Rfl: 1   Continuous Blood Gluc Sensor (FREESTYLE LIBRE 14 DAY SENSOR) MISC, Apply 1 Device topically daily., Disp: , Rfl:    diltiazem (CARDIZEM) 60 MG tablet, , Disp: , Rfl:    famotidine (PEPCID) 20 MG tablet, TAKE 1 TABLET BY MOUTH AT BEDTIME, Disp: 90 tablet, Rfl: 3   FLUoxetine (PROZAC) 20 MG capsule, Take 3 capsules (60 mg total) by mouth daily., Disp: 90 capsule, Rfl: 1   insulin lispro (HUMALOG) 100 UNIT/ML injection, , Disp: , Rfl:     JARDIANCE 10 MG TABS tablet, TK 1 T PO QD, Disp: , Rfl: 6   LANTUS SOLOSTAR 100 UNIT/ML Solostar Pen, daily. 150 units per day, Disp: , Rfl:    losartan-hydrochlorothiazide (HYZAAR) 50-12.5 MG tablet, Take 1 tablet by mouth daily., Disp: , Rfl:    metFORMIN (GLUCOPHAGE) 500 MG tablet, Take 1,000 mg by mouth daily. , Disp: , Rfl:    metoCLOPramide (REGLAN) 10 MG tablet, Take 1 tablet (10 mg total) by mouth every 6 (six) hours., Disp: 30 tablet, Rfl: 0   metoprolol succinate (TOPROL-XL) 25 MG 24 hr tablet, Take 1 tablet (25 mg total) by mouth daily. Please schedule yearly appointment for future refills. Thank you, Disp: 30 tablet, Rfl: 1   ondansetron (ZOFRAN) 4 MG tablet, as needed., Disp: , Rfl:    pantoprazole (PROTONIX) 40 MG tablet, Take 1 tablet (40 mg total) by mouth 2 (two) times daily., Disp: 180 tablet, Rfl: 3 Medication Side Effects: none  Family Medical/ Social History: Changes? No  MENTAL HEALTH EXAM:  There were no vitals taken for this visit.There is no height or weight on file to calculate BMI.  General Appearance: Casual, Neat, and Well Groomed  Eye Contact:  Good  Speech:  Clear and Coherent  Volume:  Normal  Mood:  Anxious and Depressed  Affect:  Flat  Thought Process:  Coherent  Orientation:  Full (Time, Place, and Person)  Thought Content: Logical  Suicidal Thoughts:  No  Homicidal Thoughts:  No  Memory:  WNL  Judgement:  Good  Insight:  Good  Psychomotor Activity:  Normal  Concentration:  Concentration: Good  Recall:  Good  Fund of Knowledge: Good  Language: Good  Assets:  Desire for Improvement Physical Health Resilience  ADL's:  Intact  Cognition: WNL  Prognosis:  Good    DIAGNOSES:    ICD-10-CM   1. Major depressive disorder, recurrent episode, moderate (HCC)  F33.1 Vilazodone HCl (VIIBRYD) 20 MG TABS    2. Generalized anxiety disorder  F41.1 Vilazodone HCl (VIIBRYD) 20 MG TABS    3. Caregiver role strain  Z63.8 Vilazodone HCl (VIIBRYD) 20 MG  TABS      Receiving Psychotherapy: No    RECOMMENDATIONS:  Treatment Plan/Recommendations:  Continue Wellbutrin  300 mg XL daily Start Viibryd 10 mg daily for 7 days, then 20 mg daily. Provided two starter sample packs to last 3.5 weeks while handling insurance concerns. Must take Viibryd with food to maximize absortion. Will report any side effects or worsening symptoms promptly To follow up in 4 weeks to reassess. Greater than 50% of 30 min face to face time with patient was spent on counseling and coordination of care. We discussed patients continued decline and no improvement with increased dosage of Wellbutrin and Prozac.  Reinforced healthy coping mechanisms, appropriate nutrition, exercise, and light therapy. We continued to discuss increased psychological demand and  care giver role strain. Patient still unable to get adequate  time for self-needs. Recommended psychotherapy. E scribed Viibryd to patients pharmacy and anticipate prior-authorization process.    Catherine Brooklyn, NP

## 2020-12-23 ENCOUNTER — Telehealth: Payer: Self-pay | Admitting: Internal Medicine

## 2020-12-23 ENCOUNTER — Other Ambulatory Visit: Payer: Self-pay

## 2020-12-23 ENCOUNTER — Emergency Department (HOSPITAL_COMMUNITY)
Admission: EM | Admit: 2020-12-23 | Discharge: 2020-12-24 | Disposition: A | Discharge status: Home / Self Care | Payer: 59 | Attending: Emergency Medicine | Admitting: Emergency Medicine

## 2020-12-23 ENCOUNTER — Encounter (HOSPITAL_COMMUNITY): Payer: Self-pay

## 2020-12-23 ENCOUNTER — Emergency Department (HOSPITAL_COMMUNITY): Payer: 59

## 2020-12-23 DIAGNOSIS — I10 Essential (primary) hypertension: Secondary | ICD-10-CM | POA: Insufficient documentation

## 2020-12-23 DIAGNOSIS — E860 Dehydration: Secondary | ICD-10-CM | POA: Insufficient documentation

## 2020-12-23 DIAGNOSIS — R1032 Left lower quadrant pain: Secondary | ICD-10-CM | POA: Diagnosis not present

## 2020-12-23 DIAGNOSIS — K219 Gastro-esophageal reflux disease without esophagitis: Secondary | ICD-10-CM | POA: Diagnosis not present

## 2020-12-23 DIAGNOSIS — Z87891 Personal history of nicotine dependence: Secondary | ICD-10-CM | POA: Insufficient documentation

## 2020-12-23 DIAGNOSIS — Z79899 Other long term (current) drug therapy: Secondary | ICD-10-CM | POA: Diagnosis not present

## 2020-12-23 DIAGNOSIS — R197 Diarrhea, unspecified: Secondary | ICD-10-CM | POA: Insufficient documentation

## 2020-12-23 DIAGNOSIS — Z7982 Long term (current) use of aspirin: Secondary | ICD-10-CM | POA: Insufficient documentation

## 2020-12-23 DIAGNOSIS — E119 Type 2 diabetes mellitus without complications: Secondary | ICD-10-CM | POA: Diagnosis not present

## 2020-12-23 DIAGNOSIS — R1031 Right lower quadrant pain: Secondary | ICD-10-CM | POA: Diagnosis not present

## 2020-12-23 DIAGNOSIS — Z794 Long term (current) use of insulin: Secondary | ICD-10-CM | POA: Insufficient documentation

## 2020-12-23 DIAGNOSIS — Z7984 Long term (current) use of oral hypoglycemic drugs: Secondary | ICD-10-CM | POA: Diagnosis not present

## 2020-12-23 DIAGNOSIS — R112 Nausea with vomiting, unspecified: Secondary | ICD-10-CM | POA: Diagnosis not present

## 2020-12-23 LAB — COMPREHENSIVE METABOLIC PANEL
ALT: 16 U/L (ref 0–44)
AST: 13 U/L — ABNORMAL LOW (ref 15–41)
Albumin: 4.2 g/dL (ref 3.5–5.0)
Alkaline Phosphatase: 87 U/L (ref 38–126)
Anion gap: 16 — ABNORMAL HIGH (ref 5–15)
BUN: 19 mg/dL (ref 6–20)
CO2: 26 mmol/L (ref 22–32)
Calcium: 9.6 mg/dL (ref 8.9–10.3)
Chloride: 92 mmol/L — ABNORMAL LOW (ref 98–111)
Creatinine, Ser: 0.97 mg/dL (ref 0.44–1.00)
GFR, Estimated: >60 mL/min (ref >60)
Glucose, Bld: 184 mg/dL — ABNORMAL HIGH (ref 70–99)
Potassium: 3.4 mmol/L — ABNORMAL LOW (ref 3.5–5.1)
Sodium: 134 mmol/L — ABNORMAL LOW (ref 135–145)
Total Bilirubin: 1.1 mg/dL (ref 0.3–1.2)
Total Protein: 7.8 g/dL (ref 6.5–8.1)

## 2020-12-23 LAB — CBG MONITORING, ED: Glucose-Capillary: 222 mg/dL — ABNORMAL HIGH (ref 70–99)

## 2020-12-23 LAB — I-STAT BETA HCG BLOOD, ED (MC, WL, AP ONLY): I-stat hCG, quantitative: <5 m[IU]/mL (ref <5)

## 2020-12-23 LAB — CBC
HCT: 46.5 % — ABNORMAL HIGH (ref 36.0–46.0)
Hemoglobin: 15.6 g/dL — ABNORMAL HIGH (ref 12.0–15.0)
MCH: 28.1 pg (ref 26.0–34.0)
MCHC: 33.5 g/dL (ref 30.0–36.0)
MCV: 83.8 fL (ref 80.0–100.0)
Platelets: 249 10*3/uL (ref 150–400)
RBC: 5.55 MIL/uL — ABNORMAL HIGH (ref 3.87–5.11)
RDW: 13.5 % (ref 11.5–15.5)
WBC: 9.6 10*3/uL (ref 4.0–10.5)
nRBC: 0 % (ref 0.0–0.2)

## 2020-12-23 LAB — LIPASE, BLOOD: Lipase: 26 U/L (ref 11–51)

## 2020-12-23 MED ORDER — ONDANSETRON 8 MG PO TBDP
8.0000 mg | ORAL_TABLET | Freq: Once | ORAL | Status: DC
  Filled 2020-12-23: qty 1

## 2020-12-23 MED ORDER — IOHEXOL 350 MG/ML SOLN
80.0000 mL | Freq: Once | INTRAVENOUS | Status: AC | PRN
  Administered 2020-12-23: 80 mL via INTRAVENOUS

## 2020-12-23 NOTE — Telephone Encounter (Signed)
Pt calling, states she is really hurting, reports bad stomach pains below her belly button all across her abdomen. Reports she has been hurting for 4 days. On a scale of 1-10 she reports the pain is at a 9 or 10. Discussed with pt that she should go to an urgent care or ER if she is in that much pain. Pt verbalized understanding, Dr. Henrene Pastor notified.

## 2020-12-23 NOTE — Telephone Encounter (Signed)
Not sure why she is having such severe pain.  Fairly unremarkable colonoscopy earlier this summer.  Agree with formal evaluation in the ER given the degree of pain and the uncertainty of its cause

## 2020-12-23 NOTE — ED Triage Notes (Signed)
Patient c/o RLQ pain, N/V/D x 4 days.

## 2020-12-23 NOTE — Telephone Encounter (Signed)
Pt called stating that she has been experiencing abd pain for the past 4 days. She stated that this happened a few months ago and had to go to the ED. She would like something prescribed. Pls call her.

## 2020-12-23 NOTE — ED Provider Notes (Signed)
Emergency Medicine Provider Triage Evaluation Note  Catherine Hebert , a 50 y.o. female  was evaluated in triage.  Pt complains of lower abdominal pain, states this started 4 days ago, has stayed consistent, has associated nausea, vomiting, diarrhea, she denies seeing hematemesis, coffee-ground emesis, hematochezia, melena, she denies recent hospitalizations, denies recent antibiotic use, no history of C. difficile.  She states that she has had this pain in the past, seen here 2 months ago states it was a viral infection.  She has no other complaints this time..  Review of Systems  Positive: Abdominal pain, nausea Negative: Fevers, chills, urinary symptoms.  Physical Exam  BP (!) 193/109 (BP Location: Left Arm)   Pulse (!) 101   Temp 98.4 F (36.9 C) (Oral)   Resp 16   Ht 5' 5.5" (1.664 m)   Wt 104.3 kg   SpO2 97%   BMI 37.69 kg/m  Gen:   Awake, no distress   Resp:  Normal effort  MSK:   Moves extremities without difficulty  Other:    Medical Decision Making  Medically screening exam initiated at 1:59 PM.  Appropriate orders placed.  Elvina Sidle Smoker was informed that the remainder of the evaluation will be completed by another provider, this initial triage assessment does not replace that evaluation, and the importance of remaining in the ED until their evaluation is complete.  Presents with abdominal pain, started 4 days ago, patient will need further work-up in the emergency department.   Marcello Fennel, PA-C 12/23/20 1401    Malvin Johns, MD 12/23/20 [phone]

## 2020-12-24 LAB — URINALYSIS, ROUTINE W REFLEX MICROSCOPIC
Bacteria, UA: NONE SEEN
Bilirubin Urine: NEGATIVE
Glucose, UA: >=500 mg/dL — AB
Ketones, ur: 20 mg/dL — AB
Leukocytes,Ua: NEGATIVE
Nitrite: NEGATIVE
Protein, ur: 100 mg/dL — AB
Specific Gravity, Urine: >1.046 — ABNORMAL HIGH (ref 1.005–1.030)
pH: 6 (ref 5.0–8.0)

## 2020-12-24 MED ORDER — MORPHINE SULFATE (PF) 2 MG/ML IV SOLN
2.0000 mg | Freq: Once | INTRAVENOUS | Status: AC
  Administered 2020-12-24: 2 mg via INTRAVENOUS
  Filled 2020-12-24: qty 1

## 2020-12-24 MED ORDER — SODIUM CHLORIDE 0.9 % IV BOLUS
1000.0000 mL | Freq: Once | INTRAVENOUS | Status: AC
  Administered 2020-12-24: 1000 mL via INTRAVENOUS

## 2020-12-24 MED ORDER — DICYCLOMINE HCL 20 MG PO TABS: 20.0000 mg | ORAL_TABLET | Freq: Two times a day (BID) | ORAL | 0 refills | Status: AC

## 2020-12-24 MED ORDER — ALUM & MAG HYDROXIDE-SIMETH 200-200-20 MG/5ML PO SUSP
30.0000 mL | Freq: Once | ORAL | Status: AC
  Administered 2020-12-24: 30 mL via ORAL
  Filled 2020-12-24: qty 30

## 2020-12-24 MED ORDER — ONDANSETRON HCL 4 MG/2ML IJ SOLN
4.0000 mg | Freq: Once | INTRAMUSCULAR | Status: AC
  Administered 2020-12-24: 4 mg via INTRAVENOUS
  Filled 2020-12-24: qty 2

## 2020-12-24 MED ORDER — PROMETHAZINE HCL 25 MG RE SUPP: 25.0000 mg | Freq: Four times a day (QID) | RECTAL | 0 refills | Status: AC | PRN

## 2020-12-24 MED ORDER — ONDANSETRON 4 MG PO TBDP: ORAL_TABLET | ORAL | 0 refills | Status: AC

## 2020-12-24 MED ORDER — DICYCLOMINE HCL 10 MG PO CAPS
10.0000 mg | ORAL_CAPSULE | Freq: Once | ORAL | Status: AC
  Administered 2020-12-24: 10 mg via ORAL
  Filled 2020-12-24: qty 1

## 2020-12-24 NOTE — ED Notes (Signed)
Pt ambulated to the bathroom.  

## 2020-12-24 NOTE — ED Notes (Signed)
Patient discharged. AVS and medications reviewed.

## 2020-12-24 NOTE — Discharge Instructions (Addendum)
Try pepcid or tagamet up to twice a day.  Try to avoid things that may make this worse, most commonly these are spicy foods tomato based products fatty foods chocolate and peppermint.  Alcohol and tobacco can also make this worse.  Return to the emergency department for sudden worsening pain fever or inability to eat or drink.   As this is a recurrent issue for you please discuss this with your gastroenterologist.  As we discussed before your blood work and urine were unremarkable.  They obtained a CT scan on you that had a nonspecific finding.  Your urine was not consistent with an infection.

## 2020-12-24 NOTE — ED Provider Notes (Signed)
[facility] DEPT Provider Note   CSN: [id number] Arrival date & time: 12/23/20  1310     History Chief Complaint  Patient presents with   Abdominal Pain   Emesis   Diarrhea    Catherine Hebert is a 50 y.o. female.  50 yo F with a chief complaints of nausea vomiting and diarrhea.  Going on for the past 4 days.  Having some left-sided abdominal pain with this.  Worst lower than upper.  She had similar symptoms about a month ago and was told that she likely had a viral syndrome.  Denies fevers or chills.  No sick contacts.  Denies suspicious food intake denies recent travel.  Denies bilious or bloody emesis denies dark or bloody stool.  Symptoms feel much better now than earlier today.  Denies urinary symptoms.  The history is provided by the patient.  Abdominal Pain Pain location:  L flank, suprapubic, LLQ and RLQ Pain quality: aching and cramping   Pain radiates to:  Does not radiate Pain severity:  Moderate Onset quality:  Gradual Duration:  4 days Timing:  Intermittent Progression:  Waxing and waning Chronicity:  Recurrent Relieved by:  Nothing Worsened by:  Nothing Ineffective treatments:  None tried Associated symptoms: diarrhea, nausea and vomiting   Associated symptoms: no chest pain, no chills, no dysuria, no fever and no shortness of breath   Emesis Associated symptoms: abdominal pain and diarrhea   Associated symptoms: no arthralgias, no chills, no fever, no headaches and no myalgias   Diarrhea Associated symptoms: abdominal pain and vomiting   Associated symptoms: no arthralgias, no chills, no fever, no headaches and no myalgias       Past Medical History:  Diagnosis Date   Allergic rhinitis    Bursitis of elbow    Diabetes mellitus without complication ([place]) [id number]   Diverticulosis    Esophageal reflux disease    Esophageal stricture    Fatigue    GERD (gastroesophageal reflux disease)    Hiatal hernia    Hypertension     Kidney stones    Ovarian cyst    Sleep apnea    Tachycardia 2017   ventricular tachycardia     Patient Active Problem List   Diagnosis Date Noted   Elevated troponin    Atrial tachycardia ([place]) 10/31/2015   IDDM (insulin dependent diabetes mellitus) 10/31/2015   Hypertension 10/31/2015   Hyperlipemia 10/31/2015   Obesity 10/31/2015   GERD (gastroesophageal reflux disease) 10/31/2015   Depression 10/31/2015   Leukocytosis 10/31/2015   Shingles 10/31/2015   Cellulitis and abscess of trunk-right flank 08/25/2012    Past Surgical History:  Procedure Laterality Date   CESAREAN SECTION  1998   CESAREAN SECTION  2003   i&d back abscess  08/25/12   kidney stone removal  1993   UPPER GASTROINTESTINAL ENDOSCOPY       OB History   No obstetric history on file.     Family History  Problem Relation Age of Onset   Diabetes Mother    Heart attack Mother 68   Diabetes Father    Sleep apnea Father    Heart attack Father 8   Heart attack Cousin    Colon cancer Neg Hx    Stomach cancer Neg Hx    Rectal cancer Neg Hx    Esophageal cancer Neg Hx    Colon polyps Neg Hx     Social History   Tobacco Use   Smoking status: Former  Types: Cigarettes    Quit date: 08/26/2006    Years since quitting: 14.3   Smokeless tobacco: Never  Vaping Use   Vaping Use: Never used  Substance Use Topics   Alcohol use: Not Currently    Alcohol/week: 0.0 standard drinks   Drug use: No    Home Medications Prior to Admission medications   Medication Sig Start Date End Date Taking? Authorizing Provider  dicyclomine (BENTYL) 20 MG tablet Take 1 tablet (20 mg total) by mouth 2 (two) times daily. 12/24/20  Yes Deno Etienne, DO  ondansetron (ZOFRAN ODT) 4 MG disintegrating tablet '4mg'$  ODT q4 hours prn nausea/vomit 12/24/20  Yes Deno Etienne, DO  promethazine (PHENERGAN) 25 MG suppository Place 1 suppository (25 mg total) rectally every 6 (six) hours as needed for nausea or vomiting. 12/24/20  Yes Deno Etienne, DO  aspirin 81 MG tablet Take 81 mg by mouth daily.    [provider]  buPROPion (WELLBUTRIN XL) 300 MG 24 hr tablet Take 1 tablet (300 mg total) by mouth daily. 11/04/20   [name], NP  Continuous Blood Gluc Sensor (FREESTYLE LIBRE 14 DAY SENSOR) MISC Apply 1 Device topically daily. 09/17/18   [provider]  diltiazem (CARDIZEM) 60 MG tablet     [provider]  famotidine (PEPCID) 20 MG tablet TAKE 1 TABLET BY MOUTH AT BEDTIME 08/27/20   Levin Erp, PA  FLUoxetine (PROZAC) 20 MG capsule Take 3 capsules (60 mg total) by mouth daily. 11/04/20   [name], NP  insulin lispro (HUMALOG) 100 UNIT/ML injection     [provider]  JARDIANCE 10 MG TABS tablet TK 1 T PO QD 07/07/15   [provider]  LANTUS SOLOSTAR 100 UNIT/ML Solostar Pen daily. 150 units per day 08/10/19   [provider]  losartan-hydrochlorothiazide (HYZAAR) 50-12.5 MG tablet Take 1 tablet by mouth daily. 02/23/16   [provider]  metFORMIN (GLUCOPHAGE) 500 MG tablet Take 1,000 mg by mouth daily.     [provider]  metoCLOPramide (REGLAN) 10 MG tablet Take 1 tablet (10 mg total) by mouth every 6 (six) hours. 10/14/20   McDonald, Mia A, PA-C  metoprolol succinate (TOPROL-XL) 25 MG 24 hr tablet Take 1 tablet (25 mg total) by mouth daily. Please schedule yearly appointment for future refills. Thank you 11/28/20   Evans Lance, MD  ondansetron (ZOFRAN) 4 MG tablet as needed. 08/11/18   [provider]  pantoprazole (PROTONIX) 40 MG tablet Take 1 tablet (40 mg total) by mouth 2 (two) times daily. 08/27/20   Levin Erp, PA  Vilazodone HCl (VIIBRYD) 20 MG TABS Take 1 tablet (20 mg total) by mouth daily after breakfast. 12/04/20   [name], NP    Allergies    Lisinopril  Review of Systems   Review of Systems  Constitutional:  Negative for chills and fever.  HENT:  Negative for congestion and rhinorrhea.   Eyes:   Negative for redness and visual disturbance.  Respiratory:  Negative for shortness of breath and wheezing.   Cardiovascular:  Negative for chest pain and palpitations.  Gastrointestinal:  Positive for abdominal pain, diarrhea, nausea and vomiting.  Genitourinary:  Negative for dysuria and urgency.  Musculoskeletal:  Negative for arthralgias and myalgias.  Skin:  Negative for pallor and wound.  Neurological:  Negative for dizziness and headaches.   Physical Exam Updated Vital Signs BP (!) 179/95   Pulse (!) 104   Temp 99.6 F (  37.6 C)   Resp 18   Ht 5' 5.5" (1.664 m)   Wt 104.3 kg   SpO2 95%   BMI 37.69 kg/m   Physical Exam Vitals and nursing note reviewed.  Constitutional:      General: She is not in acute distress.    Appearance: She is well-developed. She is not diaphoretic.  HENT:     Head: Normocephalic and atraumatic.  Eyes:     Pupils: Pupils are equal, round, and reactive to light.  Cardiovascular:     Rate and Rhythm: Normal rate and regular rhythm.     Heart sounds: No murmur heard.   No friction rub. No gallop.  Pulmonary:     Effort: Pulmonary effort is normal.     Breath sounds: No wheezing or rales.  Abdominal:     General: There is no distension.     Palpations: Abdomen is soft.     Tenderness: There is no abdominal tenderness.  Musculoskeletal:        General: No tenderness.     Cervical back: Normal range of motion and neck supple.  Skin:    General: Skin is warm and dry.  Neurological:     Mental Status: She is alert and oriented to person, place, and time.  Psychiatric:        Behavior: Behavior normal.    ED Results / Procedures / Treatments   Labs (all labs ordered are listed, but only abnormal results are displayed) Labs Reviewed  COMPREHENSIVE METABOLIC PANEL - Abnormal; Notable for the following components:      Result Value   Sodium 134 (*)    Potassium 3.4 (*)    Chloride 92 (*)    Glucose, Bld 184 (*)    AST 13 (*)    Anion gap  16 (*)    All other components within normal limits  CBC - Abnormal; Notable for the following components:   RBC 5.55 (*)    Hemoglobin 15.6 (*)    HCT 46.5 (*)    All other components within normal limits  URINALYSIS, ROUTINE W REFLEX MICROSCOPIC - Abnormal; Notable for the following components:   Specific Gravity, Urine >1.046 (*)    Glucose, UA >=500 (*)    Hgb urine dipstick SMALL (*)    Ketones, ur 20 (*)    Protein, ur 100 (*)    All other components within normal limits  CBG MONITORING, ED - Abnormal; Notable for the following components:   Glucose-Capillary 222 (*)    All other components within normal limits  LIPASE, BLOOD  I-STAT BETA HCG BLOOD, ED ([facility], [facility], [facility] ONLY)    EKG None  Radiology CT ABDOMEN PELVIS W CONTRAST  Result Date: 12/24/2020 CLINICAL DATA:  Right lower quadrant pain, nausea, vomiting and diarrhea for 4 days EXAM: CT ABDOMEN AND PELVIS WITH CONTRAST TECHNIQUE: Multidetector CT imaging of the abdomen and pelvis was performed using the standard protocol following bolus administration of intravenous contrast. CONTRAST:  68m OMNIPAQUE IOHEXOL 350 MG/ML SOLN COMPARISON:  CT 10/14/2020, gastric emptying exam 10/31/2018 FINDINGS: Lower chest: Lung bases are clear. Normal heart size. No pericardial effusion. Coronary artery calcifications. Calcifications on the chordae tendinae as well. Hepatobiliary: Stable subcentimeter hypodense focus in the posterior right lobe liver (2/15) too small to fully characterize though statistically likely benign. No worrisome focal liver lesions. Smooth liver surface contour. Normal hepatic attenuation. Normal gallbladder and biliary tree. Pancreas: Stable fatty cleft at the pancreatic neck. No pancreatic ductal dilatation  or surrounding inflammatory changes. Spleen: Normal in size. No concerning splenic lesions. Accessory splenule anteriorly. Adrenals/Urinary Tract: Normal adrenals. Mild bilateral perinephric stranding is stable from  comparison studies. Persistent lobular contours of the kidneys, right greater than left may reflect some degree of renal scarring. Kidneys enhance and excrete symmetrically and uniformly. No concerning focal renal lesion. No urolithiasis or hydronephrosis. Urinary bladder is largely decompressed at the time of exam and therefore poorly evaluated by CT imaging. No gross bladder abnormality accounting for underdistention. Stomach/Bowel: Sliding-type hiatal hernia. Distal stomach and duodenum are unremarkable. No large or small bowel thickening or dilatation. A normal appendix is visualized. No evidence of obstruction. Vascular/Lymphatic: Atherosclerotic calcifications within the abdominal aorta and branch vessels. No aneurysm or ectasia. No enlarged abdominopelvic lymph nodes. Reproductive: Anteverted uterus. Normal positioning of a radiodense IUD. No concerning adnexal masses. Other: No abdominopelvic free fluid or free gas. No bowel containing hernias. Musculoskeletal: No acute osseous abnormality or suspicious osseous lesion. Multilevel degenerative changes are present in the imaged portions of the spine. Additional degenerative changes of the hips and pelvis including some acetabular over coverage which could be seen in the setting of femoroacetabular impingement. IMPRESSION: Mild bilateral perinephric stranding is stable from comparison prior and can be a chronic incidental finding. Could correlate with urinalysis there is concern for urinary tract infection. Small hiatal hernia. No other acute or conspicuous abnormality to provide cause for patient's abdominal pain. Aortic Atherosclerosis (ICD10-I70.0). Electronically Signed   By: Lovena Le M.D.   On: 12/24/2020 00:19    Procedures Procedures   Medications Ordered in ED Medications  ondansetron (ZOFRAN-ODT) disintegrating tablet 8 mg (has no administration in time range)  morphine 2 MG/ML injection 2 mg (has no administration in time range)   dicyclomine (BENTYL) capsule 10 mg (has no administration in time range)  iohexol (OMNIPAQUE) 350 MG/ML injection 80 mL (80 mLs Intravenous Contrast Given 12/23/20 2302)  sodium chloride 0.9 % bolus 1,000 mL (1,000 mLs Intravenous New Bag/Given 12/24/20 0124)  ondansetron (ZOFRAN) injection 4 mg (4 mg Intravenous Given 12/24/20 0124)  alum & mag hydroxide-simeth (MAALOX/MYLANTA) 200-200-20 MG/5ML suspension 30 mL (30 mLs Oral Given 12/24/20 0124)    ED Course  I have reviewed the triage vital signs and the nursing notes.  Pertinent labs & imaging results that were available during my care of the patient were reviewed by me and considered in my medical decision making (see chart for details).    MDM Rules/Calculators/A&P                           50 yo F with a chief complaint of nausea vomiting and diarrhea off and on for the past 4 days.  Tolerating p.o. here.  Mildly dehydrated.  We will give a bolus of IV fluids.  CT scan with concern for perinephric stranding bilaterally.  Awaiting UA.  Otherwise LFTs and lipase are unremarkable.  UA without concerning finding for infection negative nitrite and leukocyte Estrace no bacteria seen.  We will discharge the patient home.  GI follow-up.  2:07 AM:  I have discussed the diagnosis/risks/treatment options with the patient and believe the pt to be eligible for discharge home to follow-up with PCP. We also discussed returning to the ED immediately if new or worsening sx occur. We discussed the sx which are most concerning (e.g., sudden worsening pain, fever, inability to tolerate by mouth) that necessitate immediate return. Medications administered to the patient during their  visit and any new prescriptions provided to the patient are listed below.  Medications given during this visit Medications  ondansetron (ZOFRAN-ODT) disintegrating tablet 8 mg (has no administration in time range)  morphine 2 MG/ML injection 2 mg (has no administration in time  range)  dicyclomine (BENTYL) capsule 10 mg (has no administration in time range)  iohexol (OMNIPAQUE) 350 MG/ML injection 80 mL (80 mLs Intravenous Contrast Given 12/23/20 2302)  sodium chloride 0.9 % bolus 1,000 mL (1,000 mLs Intravenous New Bag/Given 12/24/20 0124)  ondansetron (ZOFRAN) injection 4 mg (4 mg Intravenous Given 12/24/20 0124)  alum & mag hydroxide-simeth (MAALOX/MYLANTA) 200-200-20 MG/5ML suspension 30 mL (30 mLs Oral Given 12/24/20 0124)     The patient appears reasonably screen and/or stabilized for discharge and I doubt any other medical condition or other [facility] requiring further screening, evaluation, or treatment in the ED at this time prior to discharge.   Final Clinical Impression(s) / ED Diagnoses Final diagnoses:  Nausea vomiting and diarrhea    Rx / DC Orders ED Discharge Orders          Ordered    ondansetron (ZOFRAN ODT) 4 MG disintegrating tablet        12/24/20 0205    promethazine (PHENERGAN) 25 MG suppository  Every 6 hours PRN        12/24/20 0205    dicyclomine (BENTYL) 20 MG tablet  2 times daily        12/24/20 0206             Deno Etienne, DO 12/24/20 0207

## 2021-01-01 ENCOUNTER — Ambulatory Visit: Payer: 59 | Admitting: Behavioral Health

## 2021-01-07 ENCOUNTER — Other Ambulatory Visit: Payer: Self-pay | Admitting: Internal Medicine

## 2021-02-12 ENCOUNTER — Other Ambulatory Visit: Payer: Self-pay | Admitting: Behavioral Health

## 2021-02-12 DIAGNOSIS — F411 Generalized anxiety disorder: Secondary | ICD-10-CM

## 2021-02-12 DIAGNOSIS — F331 Major depressive disorder, recurrent, moderate: Secondary | ICD-10-CM

## 2021-02-12 DIAGNOSIS — Z638 Other specified problems related to primary support group: Secondary | ICD-10-CM

## 2021-02-13 NOTE — Telephone Encounter (Signed)
Please get scheduled with Aaron Edelman for f/up

## 2021-02-16 NOTE — Telephone Encounter (Signed)
Left message for pt to schedule appt

## 2021-02-19 NOTE — Telephone Encounter (Signed)
Pt made an appt for 10/7. Please refill script

## 2021-02-20 ENCOUNTER — Other Ambulatory Visit: Payer: Self-pay | Admitting: Behavioral Health

## 2021-02-20 DIAGNOSIS — Z638 Other specified problems related to primary support group: Secondary | ICD-10-CM

## 2021-02-20 DIAGNOSIS — F411 Generalized anxiety disorder: Secondary | ICD-10-CM

## 2021-02-20 DIAGNOSIS — F331 Major depressive disorder, recurrent, moderate: Secondary | ICD-10-CM

## 2021-03-05 ENCOUNTER — Other Ambulatory Visit: Payer: Self-pay | Admitting: Internal Medicine

## 2021-03-06 ENCOUNTER — Encounter: Payer: Self-pay | Admitting: Behavioral Health

## 2021-03-06 ENCOUNTER — Ambulatory Visit: Payer: 59 | Admitting: Behavioral Health

## 2021-03-06 ENCOUNTER — Other Ambulatory Visit: Payer: Self-pay

## 2021-03-06 DIAGNOSIS — F331 Major depressive disorder, recurrent, moderate: Secondary | ICD-10-CM

## 2021-03-06 DIAGNOSIS — F411 Generalized anxiety disorder: Secondary | ICD-10-CM | POA: Diagnosis not present

## 2021-03-06 DIAGNOSIS — Z638 Other specified problems related to primary support group: Secondary | ICD-10-CM

## 2021-03-06 MED ORDER — VILAZODONE HCL 40 MG PO TABS: 40.0000 mg | ORAL_TABLET | Freq: Every day | ORAL | 3 refills | Status: DC

## 2021-03-06 NOTE — Progress Notes (Signed)
[facility]  Patient ID: Catherine Hebert,  MRN: [id number]  PCP: Burnard Bunting, MD  Date of Evaluation: 03/06/2021 Time spent:30 minutes  Chief Complaint:  Chief Complaint   Anxiety; Depression; Follow-up; Medication Refill; Medication Problem     HISTORY/CURRENT STATUS: HPI  50 year old female presents to this office for follow up and medication management. She says that she feel like depression and anxiety have improved some but she is not where she would like to be. She says that she understands there is not a magic pill but worries that this is just how it's going to be.  She says that the demand for her husbands mental and physical needs have continued to increase and it has been very hard on her. Says that even on vacation she has to help him. They were recently at the beach and he could never make it to the sand. She feels like she could benefit from increase of Viibryd. She reports anxiety today at 4/10 and depression at 5/10. She said she still wants to sleep a lot at home, but realizes she has a lot of demands placed on her between work and home. No mania present. No psychosis. No SI/HI.    Prior psychiatric medication trials: Wellbutrin Prozac      Individual Medical History/ Review of Systems: Changes? :No   Allergies: Lisinopril  Current Medications:  Current Outpatient Medications:    Vilazodone HCl (VIIBRYD) 40 MG TABS, Take 1 tablet (40 mg total) by mouth daily., Disp: 30 tablet, Rfl: 3   aspirin 81 MG tablet, Take 81 mg by mouth daily., Disp: , Rfl:    buPROPion (WELLBUTRIN XL) 300 MG 24 hr tablet, Take 1 tablet (300 mg total) by mouth daily., Disp: 30 tablet, Rfl: 1   Continuous Blood Gluc Sensor (FREESTYLE LIBRE 14 DAY SENSOR) MISC, Apply 1 Device topically daily., Disp: , Rfl:    dicyclomine (BENTYL) 20 MG tablet, Take 1 tablet (20 mg total) by mouth 2 (two) times daily., Disp: 20 tablet, Rfl: 0   diltiazem (CARDIZEM) 60 MG tablet, , Disp: ,  Rfl:    famotidine (PEPCID) 20 MG tablet, TAKE 1 TABLET BY MOUTH AT BEDTIME, Disp: 90 tablet, Rfl: 3   insulin lispro (HUMALOG) 100 UNIT/ML injection, , Disp: , Rfl:    JARDIANCE 10 MG TABS tablet, TK 1 T PO QD, Disp: , Rfl: 6   LANTUS SOLOSTAR 100 UNIT/ML Solostar Pen, daily. 150 units per day, Disp: , Rfl:    losartan-hydrochlorothiazide (HYZAAR) 50-12.5 MG tablet, Take 1 tablet by mouth daily., Disp: , Rfl:    metFORMIN (GLUCOPHAGE) 500 MG tablet, Take 1,000 mg by mouth daily. , Disp: , Rfl:    metoCLOPramide (REGLAN) 10 MG tablet, Take 1 tablet (10 mg total) by mouth every 6 (six) hours., Disp: 30 tablet, Rfl: 0   metoprolol succinate (TOPROL-XL) 25 MG 24 hr tablet, TAKE 1 TABLET BY MOUTH  DAILY, Disp: 60 tablet, Rfl: 0   ondansetron (ZOFRAN ODT) 4 MG disintegrating tablet, 4mg  ODT q4 hours prn nausea/vomit, Disp: 20 tablet, Rfl: 0   ondansetron (ZOFRAN) 4 MG tablet, as needed., Disp: , Rfl:    pantoprazole (PROTONIX) 40 MG tablet, Take 1 tablet (40 mg total) by mouth 2 (two) times daily., Disp: 180 tablet, Rfl: 3   promethazine (PHENERGAN) 25 MG suppository, Place 1 suppository (25 mg total) rectally every 6 (six) hours as needed for nausea or vomiting., Disp: 12 each, Rfl: 0 Medication Side Effects: anxiety  Family Medical/ Social History: Changes? No  MENTAL HEALTH EXAM:  There were no vitals taken for this visit.There is no height or weight on file to calculate BMI.  General Appearance: Casual, Neat, and Well Groomed  Eye Contact:  Good  Speech:  Clear and Coherent  Volume:  Normal  Mood:  Anxious and Depressed  Affect:  Appropriate, Congruent, and Tearful  Thought Process:  Coherent  Orientation:  Full (Time, Place, and Person)  Thought Content: Logical   Suicidal Thoughts:  No  Homicidal Thoughts:  No  Memory:  WNL  Judgement:  Good  Insight:  Good  Psychomotor Activity:  Normal  Concentration:  Concentration: Good  Recall:  Good  Fund of Knowledge: Good  Language:  Good  Assets:  Desire for Improvement  ADL's:  Intact  Cognition: WNL  Prognosis:  Good    DIAGNOSES:    ICD-10-CM   1. Major depressive disorder, recurrent episode, moderate (HCC)  F33.1 Vilazodone HCl (VIIBRYD) 40 MG TABS    2. Generalized anxiety disorder  F41.1 Vilazodone HCl (VIIBRYD) 40 MG TABS    3. Caregiver role strain  Z63.8 Vilazodone HCl (VIIBRYD) 40 MG TABS      Receiving Psychotherapy: No    RECOMMENDATIONS:   Treatment Plan/Recommendations:  Continue Wellbutrin  300 mg XL daily Increase Viibryd to 40 mg daily Must take Viibryd with food to maximize absortion. Will report any side effects or worsening symptoms promptly To follow up in 4 weeks to reassess. Greater than 50% of 30 min face to face time with patient was spent on counseling and coordination of care. Discussed patients overall improvement with anxiety and depression but still not where she would like to be. She is requesting increase with the Viibryd.  Reinforced healthy coping mechanisms, appropriate nutrition, exercise, and light therapy. We continued to discuss increased psychological demand and  care giver role strain. Patient still unable to get adequate  time for self-needs. Recommended psychotherapy.  Reviewed PDMP      [name], NP

## 2021-04-01 ENCOUNTER — Other Ambulatory Visit: Payer: Self-pay | Admitting: Behavioral Health

## 2021-04-01 DIAGNOSIS — Z638 Other specified problems related to primary support group: Secondary | ICD-10-CM

## 2021-04-01 DIAGNOSIS — F331 Major depressive disorder, recurrent, moderate: Secondary | ICD-10-CM

## 2021-04-01 DIAGNOSIS — F411 Generalized anxiety disorder: Secondary | ICD-10-CM

## 2021-04-03 ENCOUNTER — Other Ambulatory Visit: Payer: Self-pay

## 2021-04-03 ENCOUNTER — Ambulatory Visit (INDEPENDENT_AMBULATORY_CARE_PROVIDER_SITE_OTHER): Payer: 59 | Admitting: Behavioral Health

## 2021-04-03 ENCOUNTER — Encounter: Payer: Self-pay | Admitting: Behavioral Health

## 2021-04-03 DIAGNOSIS — F411 Generalized anxiety disorder: Secondary | ICD-10-CM

## 2021-04-03 DIAGNOSIS — F331 Major depressive disorder, recurrent, moderate: Secondary | ICD-10-CM

## 2021-04-03 DIAGNOSIS — Z638 Other specified problems related to primary support group: Secondary | ICD-10-CM | POA: Diagnosis not present

## 2021-04-03 MED ORDER — BUSPIRONE HCL 15 MG PO TABS: ORAL_TABLET | ORAL | 1 refills | Status: DC

## 2021-04-03 NOTE — Progress Notes (Signed)
[facility]  Patient ID: Catherine Hebert,  MRN: [id number]  PCP: Burnard Bunting, MD  Date of Evaluation: 04/03/2021 Time spent:30 minutes  Chief Complaint:  Chief Complaint   Anxiety; Depression; Fatigue; Follow-up; Medication Refill     HISTORY/CURRENT STATUS: HPI 50 year old female presents to this office for follow up and medication management. She says that she feel like depression and anxiety have have improved slightly but still feeling very fatigued and not wanting to get out the bed in the am. She does manage to get to work everyday and says symptoms typically improve later in the morning. She says that the demand for her husbands mental and physical needs have increased and it has been very hard on her the last few weeks. She says that she would like to give Viibryd 40 mg more time to work since its only been less than 4 weeks since increase. She would like to consider another medication for anxiety. Says her anxiety is 6/10 today and depression is 4/10. She does sleep 8 plus hours. No mania present. No psychosis. No SI/HI.    Prior psychiatric medication trials: Wellbutrin Prozac       Individual Medical History/ Review of Systems: Changes? :No   Allergies: Lisinopril  Current Medications:  Current Outpatient Medications:    busPIRone (BUSPAR) 15 MG tablet, Take 1/3 tablet p.o. twice daily for 1 week, then take 2/3 tablet p.o. twice daily for 1 week, then take 1 tablet p.o. twice daily, Disp: 60 tablet, Rfl: 1   aspirin 81 MG tablet, Take 81 mg by mouth daily., Disp: , Rfl:    buPROPion (WELLBUTRIN XL) 300 MG 24 hr tablet, Take 1 tablet (300 mg total) by mouth daily., Disp: 30 tablet, Rfl: 1   Continuous Blood Gluc Sensor (FREESTYLE LIBRE 14 DAY SENSOR) MISC, Apply 1 Device topically daily., Disp: , Rfl:    dicyclomine (BENTYL) 20 MG tablet, Take 1 tablet (20 mg total) by mouth 2 (two) times daily., Disp: 20 tablet, Rfl: 0   diltiazem (CARDIZEM) 60 MG  tablet, , Disp: , Rfl:    famotidine (PEPCID) 20 MG tablet, TAKE 1 TABLET BY MOUTH AT BEDTIME, Disp: 90 tablet, Rfl: 3   insulin lispro (HUMALOG) 100 UNIT/ML injection, , Disp: , Rfl:    JARDIANCE 10 MG TABS tablet, TK 1 T PO QD, Disp: , Rfl: 6   LANTUS SOLOSTAR 100 UNIT/ML Solostar Pen, daily. 150 units per day, Disp: , Rfl:    losartan-hydrochlorothiazide (HYZAAR) 50-12.5 MG tablet, Take 1 tablet by mouth daily., Disp: , Rfl:    metFORMIN (GLUCOPHAGE) 500 MG tablet, Take 1,000 mg by mouth daily. , Disp: , Rfl:    metoCLOPramide (REGLAN) 10 MG tablet, Take 1 tablet (10 mg total) by mouth every 6 (six) hours., Disp: 30 tablet, Rfl: 0   metoprolol succinate (TOPROL-XL) 25 MG 24 hr tablet, Take 1 tablet (25 mg total) by mouth daily. Please schedule overdue appt. With cardiologist in order to receive further refills. Thank You. 1st. Attempt., Disp: 30 tablet, Rfl: 0   ondansetron (ZOFRAN ODT) 4 MG disintegrating tablet, 4mg  ODT q4 hours prn nausea/vomit, Disp: 20 tablet, Rfl: 0   ondansetron (ZOFRAN) 4 MG tablet, as needed., Disp: , Rfl:    pantoprazole (PROTONIX) 40 MG tablet, Take 1 tablet (40 mg total) by mouth 2 (two) times daily., Disp: 180 tablet, Rfl: 3   promethazine (PHENERGAN) 25 MG suppository, Place 1 suppository (25 mg total) rectally every 6 (six) hours as  needed for nausea or vomiting., Disp: 12 each, Rfl: 0   Vilazodone HCl (VIIBRYD) 40 MG TABS, Take 1 tablet (40 mg total) by mouth daily., Disp: 30 tablet, Rfl: 3 Medication Side Effects: none  Family Medical/ Social History: Changes? No  MENTAL HEALTH EXAM:  There were no vitals taken for this visit.There is no height or weight on file to calculate BMI.  General Appearance: Casual, Neat, and Well Groomed  Eye Contact:  Good  Speech:  Clear and Coherent  Volume:  Normal  Mood:  Anxious and Depressed  Affect:  Appropriate  Thought Process:  Coherent  Orientation:  Full (Time, Place, and Person)  Thought Content: Logical    Suicidal Thoughts:  No  Homicidal Thoughts:  No  Memory:  WNL  Judgement:  Good  Insight:  Good  Psychomotor Activity:  Normal  Concentration:  Concentration: Good  Recall:  Good  Fund of Knowledge: Good  Language: Good  Assets:  Desire for Improvement  ADL's:  Intact  Cognition: WNL  Prognosis:  Good    DIAGNOSES:    ICD-10-CM   1. Major depressive disorder, recurrent episode, moderate (HCC)  F33.1 busPIRone (BUSPAR) 15 MG tablet    2. Generalized anxiety disorder  F41.1 busPIRone (BUSPAR) 15 MG tablet    3. Caregiver role strain  Z63.8       Receiving Psychotherapy: No    RECOMMENDATIONS:   Treatment Plan/Recommendations:  Continue Wellbutrin  300 mg XL daily Continue Viibryd 40 mg daily Discussed potential benefits, risks, and side effects of BuSpar.  Patient agrees to trial of BuSpar.  Will start BuSpar 15 mg 1/3 tablet twice daily for 1 week, then increase to 2/3 tablet twice daily for 1 week, then increase to 1 tablet twice daily for anxiety.  Must take Viibryd with food to maximize absortion. Will report any side effects or worsening symptoms promptly To follow up in 4 weeks to reassess. Greater than 50% of 30 min face to face time with patient was spent on counseling and coordination of care. Discussed moderate improvement since increasing the Viibryd. She still struggles with fatigue in the am. I am suspecting more physiological etiology due to non compliance with C-pap. It was recommended she follow back up with PCP or pulmonologist. She would like to inquire about the Inspire device for sleep Apnea.  Reinforced healthy coping mechanisms, appropriate nutrition, exercise, and light therapy. We continued to discuss increased psychological demand and  care giver role strain. Patient still unable to get adequate  time for self-needs. Recommended psychotherapy. E scribed Viibryd to patients pharmacy and anticipate prior-authorization process.          [name], NP

## 2021-04-18 ENCOUNTER — Other Ambulatory Visit: Payer: Self-pay | Admitting: Internal Medicine

## 2021-04-30 ENCOUNTER — Other Ambulatory Visit: Payer: Self-pay | Admitting: Internal Medicine

## 2021-05-01 ENCOUNTER — Ambulatory Visit: Payer: 59 | Admitting: Behavioral Health

## 2021-06-06 ENCOUNTER — Other Ambulatory Visit: Payer: Self-pay | Admitting: Behavioral Health

## 2021-06-06 DIAGNOSIS — F411 Generalized anxiety disorder: Secondary | ICD-10-CM

## 2021-06-06 DIAGNOSIS — Z638 Other specified problems related to primary support group: Secondary | ICD-10-CM

## 2021-06-06 DIAGNOSIS — F331 Major depressive disorder, recurrent, moderate: Secondary | ICD-10-CM

## 2021-06-08 ENCOUNTER — Other Ambulatory Visit: Payer: Self-pay

## 2021-06-08 DIAGNOSIS — F331 Major depressive disorder, recurrent, moderate: Secondary | ICD-10-CM

## 2021-06-08 DIAGNOSIS — F411 Generalized anxiety disorder: Secondary | ICD-10-CM

## 2021-06-08 DIAGNOSIS — Z638 Other specified problems related to primary support group: Secondary | ICD-10-CM

## 2021-06-08 MED ORDER — BUSPIRONE HCL 15 MG PO TABS: ORAL_TABLET | ORAL | 0 refills | Status: AC

## 2021-06-08 MED ORDER — VILAZODONE HCL 40 MG PO TABS: 40.0000 mg | ORAL_TABLET | Freq: Every day | ORAL | 0 refills | Status: DC

## 2021-06-08 NOTE — Telephone Encounter (Signed)
Pt will need to get your 90 day RX for this from [facility] order. She does not want it sent in to [facility]. Please send 90 day to [facility] for Vilazodone.

## 2021-06-11 ENCOUNTER — Telehealth: Payer: Self-pay

## 2021-06-19 ENCOUNTER — Ambulatory Visit: Payer: 59 | Admitting: Behavioral Health

## 2021-06-25 NOTE — Telephone Encounter (Signed)
error 

## 2021-06-26 ENCOUNTER — Ambulatory Visit: Payer: 59 | Admitting: Behavioral Health

## 2021-08-14 ENCOUNTER — Other Ambulatory Visit: Payer: Self-pay | Admitting: Behavioral Health

## 2021-08-14 DIAGNOSIS — Z638 Other specified problems related to primary support group: Secondary | ICD-10-CM

## 2021-08-14 DIAGNOSIS — F331 Major depressive disorder, recurrent, moderate: Secondary | ICD-10-CM

## 2021-08-14 DIAGNOSIS — F411 Generalized anxiety disorder: Secondary | ICD-10-CM

## 2021-08-26 ENCOUNTER — Encounter: Payer: Self-pay | Admitting: Behavioral Health

## 2021-08-26 ENCOUNTER — Other Ambulatory Visit: Payer: Self-pay

## 2021-08-26 ENCOUNTER — Ambulatory Visit (INDEPENDENT_AMBULATORY_CARE_PROVIDER_SITE_OTHER): Payer: 59 | Admitting: Behavioral Health

## 2021-08-26 DIAGNOSIS — F411 Generalized anxiety disorder: Secondary | ICD-10-CM

## 2021-08-26 DIAGNOSIS — F331 Major depressive disorder, recurrent, moderate: Secondary | ICD-10-CM | POA: Diagnosis not present

## 2021-08-26 DIAGNOSIS — Z638 Other specified problems related to primary support group: Secondary | ICD-10-CM | POA: Diagnosis not present

## 2021-08-26 NOTE — Progress Notes (Signed)
[facility] ? ?Patient ID: Catherine Hebert,  ?MRN: [id number] ? ?PCP: Burnard Bunting, MD ? ?Date of Evaluation: 08/26/2021 ?Time spent:30 minutes ? ?Chief Complaint:  ?Chief Complaint   ?Anxiety; Depression; Follow-up; Medication Refill; Family Problem; Care Giver Role Strain ?  ? ? ?HISTORY/CURRENT STATUS: ?HPI ? ?51 year old female presents to this office for follow up and medication management. She says that she feel like depression and anxiety have have improved slightly but still feeling very fatigued and not wanting to get out the bed in the am. Says that she is doing much better on her job. She has new boss and work environment has improved. She feels like her medications are working but she has so many situational stressors. She knows that she needs to make difficult decisions concerning care of her husband. She says that the demand for her husbands mental and physical needs have increased and it has been very hard on her the last few weeks. She does not want to change her medications at this time. Says her anxiety is 6/10 today and depression is 4/10. She does sleep 8 plus hours. No mania present. No psychosis. No SI/HI.  ?  ?Prior psychiatric medication trials: ?Wellbutrin ?Prozac  ?  ? ?Individual Medical History/ Review of Systems: Changes? :No  ? ?Allergies: Lisinopril ? ?Current Medications:  ?Current Outpatient Medications:  ?  aspirin 81 MG tablet, Take 81 mg by mouth daily., Disp: , Rfl:  ?  buPROPion (WELLBUTRIN XL) 300 MG 24 hr tablet, Take 1 tablet (300 mg total) by mouth daily., Disp: 30 tablet, Rfl: 1 ?  busPIRone (BUSPAR) 15 MG tablet, Take 1 tablet p.o. twice daily, Disp: 180 tablet, Rfl: 0 ?  Continuous Blood Gluc Sensor (FREESTYLE LIBRE 14 DAY SENSOR) MISC, Apply 1 Device topically daily., Disp: , Rfl:  ?  dicyclomine (BENTYL) 20 MG tablet, Take 1 tablet (20 mg total) by mouth 2 (two) times daily., Disp: 20 tablet, Rfl: 0 ?  diltiazem (CARDIZEM) 60 MG tablet, , Disp: , Rfl:   ?  famotidine (PEPCID) 20 MG tablet, TAKE 1 TABLET BY MOUTH AT BEDTIME, Disp: 90 tablet, Rfl: 3 ?  insulin lispro (HUMALOG) 100 UNIT/ML injection, , Disp: , Rfl:  ?  JARDIANCE 10 MG TABS tablet, TK 1 T PO QD, Disp: , Rfl: 6 ?  LANTUS SOLOSTAR 100 UNIT/ML Solostar Pen, daily. 150 units per day, Disp: , Rfl:  ?  losartan-hydrochlorothiazide (HYZAAR) 50-12.5 MG tablet, Take 1 tablet by mouth daily., Disp: , Rfl:  ?  metFORMIN (GLUCOPHAGE) 500 MG tablet, Take 1,000 mg by mouth daily. , Disp: , Rfl:  ?  metoCLOPramide (REGLAN) 10 MG tablet, Take 1 tablet (10 mg total) by mouth every 6 (six) hours., Disp: 30 tablet, Rfl: 0 ?  metoprolol succinate (TOPROL-XL) 25 MG 24 hr tablet, TAKE 1 TABLET BY MOUTH DAILY., Disp: 15 tablet, Rfl: 0 ?  ondansetron (ZOFRAN ODT) 4 MG disintegrating tablet, '4mg'$  ODT q4 hours prn nausea/vomit, Disp: 20 tablet, Rfl: 0 ?  ondansetron (ZOFRAN) 4 MG tablet, as needed., Disp: , Rfl:  ?  pantoprazole (PROTONIX) 40 MG tablet, Take 1 tablet (40 mg total) by mouth 2 (two) times daily., Disp: 180 tablet, Rfl: 3 ?  promethazine (PHENERGAN) 25 MG suppository, Place 1 suppository (25 mg total) rectally every 6 (six) hours as needed for nausea or vomiting., Disp: 12 each, Rfl: 0 ?  Vilazodone HCl (VIIBRYD) 40 MG TABS, TAKE 1 TABLET BY MOUTH DAILY, Disp: 90 tablet, Rfl:  0 ?Medication Side Effects: none ? ?Family Medical/ Social History: Changes? No ? ?MENTAL HEALTH EXAM: ? ?There were no vitals taken for this visit.There is no height or weight on file to calculate BMI.  ?General Appearance: Casual, Neat, and Well Groomed  ?Eye Contact:  Good  ?Speech:  Clear and Coherent  ?Volume:  Normal  ?Mood:  Anxious, Depressed, and Dysphoric  ?Affect:  Depressed, Tearful, and Anxious  ?Thought Process:  Coherent  ?Orientation:  Full (Time, Place, and Person)  ?Thought Content: Logical   ?Suicidal Thoughts:  No  ?Homicidal Thoughts:  No  ?Memory:  WNL  ?Judgement:  Good  ?Insight:  Good  ?Psychomotor Activity:  Normal   ?Concentration:  Concentration: Good  ?Recall:  Good  ?Fund of Knowledge: Good  ?Language: Good  ?Assets:  Desire for Improvement  ?ADL's:  Intact  ?Cognition: WNL  ?Prognosis:  Good  ? ? ?DIAGNOSES:  ?  ICD-10-CM   ?1. Major depressive disorder, recurrent episode, moderate (HCC)  F33.1   ?  ?2. Generalized anxiety disorder  F41.1   ?  ?3. Caregiver role strain  Z63.8   ?  ? ? ?Receiving Psychotherapy: No  ? ? ?RECOMMENDATIONS:  ? ?Treatment Plan/Recommendations:  ?Continue Wellbutrin  300 mg XL daily ?Continue Viibryd 40 mg daily ?Continue Buspar 15 mg twice daily. May take third tablet if necessary. Take doses 7-8 hours apart.  ?Must take Viibryd with food to maximize absortion. ?Will report any side effects or worsening symptoms promptly ?To follow up in 4 weeks to reassess. ?Greater than 50% of 30 min face to face time with patient was spent on counseling and coordination of care. Discussed moderate improvement since increasing the Viibryd. She still struggles with fatigue in the am. I am suspecting more physiological etiology due to non compliance with C-pap. It was recommended she follow back up with PCP or pulmonologist. She has had more responsibility at home recently and has not had the opportunity to follow up with PCP about the fatigue. She would like to inquire about the Inspire device for sleep Apnea.  Reinforced healthy coping mechanisms, appropriate nutrition, exercise, and light therapy. We continued to discuss increased psychological demand and  care giver role strain which is increasingly worse. Recommended pt reach out to Hospice for assessment and additional resources. Patient still unable to get adequate  time for self-needs. Recommended psychotherapy. E scribed Viibryd to patients pharmacy and anticipate prior-authorization process.  ?  ?  ? ? ? ? ?[name], NP  ?

## 2021-09-18 ENCOUNTER — Other Ambulatory Visit: Payer: Self-pay | Admitting: Physician Assistant

## 2021-09-28 DEATH — deceased

## 2021-11-26 ENCOUNTER — Ambulatory Visit (INDEPENDENT_AMBULATORY_CARE_PROVIDER_SITE_OTHER): Payer: Self-pay | Admitting: Behavioral Health

## 2021-11-26 DIAGNOSIS — F489 Nonpsychotic mental disorder, unspecified: Secondary | ICD-10-CM

## 2021-11-26 NOTE — Progress Notes (Signed)
Patient did not show for scheduled appointment no did provide 24 hr notice as required. Fee to be assessed.
